# Patient Record
Sex: Male | Born: 1960 | Race: White | Hispanic: No | Smoking: Never smoker
Health system: Southern US, Community
[De-identification: ages and names within clinical notes are randomized; demographics above are authoritative.]

## PROBLEM LIST (undated history)

## (undated) DIAGNOSIS — Z789 Other specified health status: Secondary | ICD-10-CM

## (undated) DIAGNOSIS — I10 Essential (primary) hypertension: Secondary | ICD-10-CM

## (undated) HISTORY — DX: Essential (primary) hypertension: I10

## (undated) HISTORY — PX: APPENDECTOMY: SHX54

---

## 2011-08-16 ENCOUNTER — Encounter (HOSPITAL_BASED_OUTPATIENT_CLINIC_OR_DEPARTMENT_OTHER): Payer: Self-pay | Admitting: Orthopedic Surgery

## 2011-08-16 ENCOUNTER — Ambulatory Visit (HOSPITAL_BASED_OUTPATIENT_CLINIC_OR_DEPARTMENT_OTHER)
Admission: RE | Admit: 2011-08-16 | Discharge: 2011-08-16 | Disposition: A | Discharge status: Home / Self Care | Payer: Worker's Compensation | Source: Ambulatory Visit | Attending: Orthopedic Surgery | Admitting: Orthopedic Surgery

## 2011-08-16 ENCOUNTER — Encounter (HOSPITAL_BASED_OUTPATIENT_CLINIC_OR_DEPARTMENT_OTHER)
Admission: RE | Disposition: A | Discharge status: Home / Self Care | Payer: Self-pay | Source: Ambulatory Visit | Attending: Orthopedic Surgery

## 2011-08-16 DIAGNOSIS — S62639B Displaced fracture of distal phalanx of unspecified finger, initial encounter for open fracture: Secondary | ICD-10-CM | POA: Insufficient documentation

## 2011-08-16 DIAGNOSIS — W230XXA Caught, crushed, jammed, or pinched between moving objects, initial encounter: Secondary | ICD-10-CM | POA: Insufficient documentation

## 2011-08-16 DIAGNOSIS — Y929 Unspecified place or not applicable: Secondary | ICD-10-CM | POA: Insufficient documentation

## 2011-08-16 DIAGNOSIS — S52509A Unspecified fracture of the lower end of unspecified radius, initial encounter for closed fracture: Secondary | ICD-10-CM

## 2011-08-16 DIAGNOSIS — Y990 Civilian activity done for income or pay: Secondary | ICD-10-CM | POA: Insufficient documentation

## 2011-08-16 HISTORY — PX: I & D EXTREMITY: SHX5045

## 2011-08-16 SURGERY — MINOR IRRIGATION AND DEBRIDEMENT EXTREMITY
Anesthesia: LOCAL | Site: Finger | Laterality: Left | Wound class: Contaminated

## 2011-08-16 MED ORDER — CHLORHEXIDINE GLUCONATE 4 % EX LIQD: 60.0000 mL | Freq: Once | CUTANEOUS | Status: DC

## 2011-08-16 MED ORDER — HYDROCODONE-ACETAMINOPHEN 5-500 MG PO TABS: 1.0000 | ORAL_TABLET | ORAL | Status: AC | PRN

## 2011-08-16 MED ORDER — LIDOCAINE HCL (PF) 1 % IJ SOLN
INTRAMUSCULAR | Status: DC | PRN
  Administered 2011-08-16: 5 mL

## 2011-08-16 MED ORDER — BUPIVACAINE HCL (PF) 0.25 % IJ SOLN
INTRAMUSCULAR | Status: DC | PRN
  Administered 2011-08-16: 5 mL

## 2011-08-16 SURGICAL SUPPLY — 45 items
BAG DECANTER FOR FLEXI CONT (MISCELLANEOUS) IMPLANT
BANDAGE GAUZE ELAST BULKY 4 IN (GAUZE/BANDAGES/DRESSINGS) IMPLANT
BLADE MINI RND TIP GREEN BEAV (BLADE) IMPLANT
BLADE SURG 15 STRL LF DISP TIS (BLADE) ×1 IMPLANT
BLADE SURG 15 STRL SS (BLADE) ×2
BNDG CMPR 9X4 STRL LF SNTH (GAUZE/BANDAGES/DRESSINGS)
BNDG COHESIVE 1X5 TAN STRL LF (GAUZE/BANDAGES/DRESSINGS) ×1 IMPLANT
BNDG COHESIVE 3X5 TAN STRL LF (GAUZE/BANDAGES/DRESSINGS) IMPLANT
BNDG ESMARK 4X9 LF (GAUZE/BANDAGES/DRESSINGS) IMPLANT
BRUSH SCRUB DISP (MISCELLANEOUS) ×1 IMPLANT
CHLORAPREP W/TINT 26ML (MISCELLANEOUS) ×2 IMPLANT
CLOTH BEACON ORANGE TIMEOUT ST (SAFETY) ×2 IMPLANT
CORDS BIPOLAR (ELECTRODE) IMPLANT
COVER MAYO STAND STRL (DRAPES) ×2 IMPLANT
CUFF TOURNIQUET SINGLE 18IN (TOURNIQUET CUFF) IMPLANT
DRAIN PENROSE 1/2X12 LTX STRL (WOUND CARE) ×1 IMPLANT
DRAIN PENROSE 1/4X12 LTX STRL (WOUND CARE) IMPLANT
DRAPE SURG 17X23 STRL (DRAPES) ×2 IMPLANT
GAUZE PACKING IODOFORM 1/4X5 (PACKING) IMPLANT
GAUZE XEROFORM 1X8 LF (GAUZE/BANDAGES/DRESSINGS) ×2 IMPLANT
GLOVE BIO SURGEON STRL SZ 6.5 (GLOVE) ×3 IMPLANT
GLOVE SURG ORTHO 8.0 STRL STRW (GLOVE) ×2 IMPLANT
LOOP VESSEL MAXI BLUE (MISCELLANEOUS) IMPLANT
NEEDLE 27GAX1X1/2 (NEEDLE) ×1 IMPLANT
NS IRRIG 1000ML POUR BTL (IV SOLUTION) ×2 IMPLANT
PACK BASIN DAY SURGERY FS (CUSTOM PROCEDURE TRAY) ×2 IMPLANT
PAD CAST 3X4 CTTN HI CHSV (CAST SUPPLIES) IMPLANT
PADDING CAST ABS 4INX4YD NS (CAST SUPPLIES)
PADDING CAST ABS COTTON 4X4 ST (CAST SUPPLIES) ×1 IMPLANT
PADDING CAST COTTON 3X4 STRL (CAST SUPPLIES)
SPLINT PLASTER CAST XFAST 3X15 (CAST SUPPLIES) IMPLANT
SPLINT PLASTER XTRA FASTSET 3X (CAST SUPPLIES)
SPONGE GAUZE 4X4 12PLY (GAUZE/BANDAGES/DRESSINGS) ×2 IMPLANT
SUT CHROMIC 6 0 PS 4 (SUTURE) ×1 IMPLANT
SUT VICRYL RAPID 5 0 P 3 (SUTURE) IMPLANT
SUT VICRYL RAPIDE 4/0 PS 2 (SUTURE) ×1 IMPLANT
SWAB CULTURE LIQ STUART DBL (MISCELLANEOUS) IMPLANT
SYR BULB 3OZ (MISCELLANEOUS) ×2 IMPLANT
SYR CONTROL 10ML LL (SYRINGE) ×1 IMPLANT
TOWEL OR 17X24 6PK STRL BLUE (TOWEL DISPOSABLE) ×4 IMPLANT
TRAY DSU PREP LF (CUSTOM PROCEDURE TRAY) ×1 IMPLANT
TUBE ANAEROBIC SPECIMEN COL (MISCELLANEOUS) IMPLANT
TUBE FEEDING 5FR 15 INCH (TUBING) IMPLANT
UNDERPAD 30X30 INCONTINENT (UNDERPADS AND DIAPERS) ×2 IMPLANT
WATER STERILE IRR 1000ML POUR (IV SOLUTION) ×1 IMPLANT

## 2011-08-16 NOTE — Brief Op Note (Signed)
08/16/2011  1:17 PM  PATIENT:  Vincent Henderson  51 y.o. male  PRE-OPERATIVE DIAGNOSIS:  crush injury to left ring figner  POST-OPERATIVE DIAGNOSIS:  * No post-op diagnosis entered *  PROCEDURE:  Procedure(s) (LRB): MINOR IRRIGATION AND DEBRIDEMENT EXTREMITY (Left)  SURGEON:  Surgeon(s) and Role:    * Nicki Reaper, MD - Primary  PHYSICIAN ASSISTANT:   ASSISTANTS: none   ANESTHESIA:   local  EBL:     BLOOD ADMINISTERED:none  DRAINS: none   LOCAL MEDICATIONS USED:  MARCAINE    and XYLOCAINE   SPECIMEN:  No Specimen  DISPOSITION OF SPECIMEN:  N/A  COUNTS:  YES  TOURNIQUET:  * Missing tourniquet times found for documented tourniquets in log:  [id number] *  DICTATION: .Other Dictation: Dictation Number [id number]  PLAN OF CARE: Discharge to home after PACU  PATIENT DISPOSITION:  PACU - hemodynamically stable.

## 2011-08-16 NOTE — Discharge Instructions (Signed)

## 2011-08-16 NOTE — Op Note (Signed)
NAMEPAO, HAFFEY NO.:  [id number]  MEDICAL RECORD NO.:  [id number]  LOCATION:                                 FACILITY:  PHYSICIAN:  Cindee Salt, M.D.            DATE OF BIRTH:  DATE OF PROCEDURE:  08/16/2011 DATE OF DISCHARGE:                              OPERATIVE REPORT   PREOPERATIVE DIAGNOSIS:  Crush injury, open fracture nail-bed injury, left ring finger.  POSTOPERATIVE DIAGNOSIS:  Crush injury, open fracture nail-bed injury, left ring finger.  OPERATION:  Repair of nail bed, reduction and irrigation of distal phalanx fracture, left ring finger.  SURGEON:  Cindee Salt, M.D.  ANESTHESIA:  Metacarpal block.  HISTORY:  The patient is a 51 year old male who suffered a crush injury to his left ring finger when a manhole-cover fell on it.  He had this drained, but he has a full subungual hematoma with significant injury around the nail plate, nail matrix.  X-rays reveal a fracture of distal phalanx.  He is admitted for removal of nail plate with repair of nail bed and reduction of the fracture of distal phalanx.  He is aware of risks and complications.  In the preoperative area, the patient is seen, the extremity marked by both the patient and surgeon.  PROCEDURE:  The patient was brought to the operating room where a metacarpal block was given with 0.25% Marcaine and 1% Xylocaine both without epinephrine.  He was prepped using Betadine scrub and solution, followed by ChloraPrep.  A 3-minute dry time was allowed.  Time-out taken, confirming the patient and procedure.  After adequate anesthesia was afforded, following metacarpal block prior to the prep, a Penrose drain was used for tourniquet control at the base of the finger and the nail plate was removed.  The nail bed was then elevated allowing the complete full laceration across the entire width of the nail matrix. The fracture was irrigated and reduced.  The nail matrix was then repaired with  multiple interrupted 6-0 chromic sutures.  This stabilized the fracture.  The nail plate was then reapproximated with proximal nail fold.  A sterile compressive dressing and splint was applied.  The patient tolerated the procedure well.  With removal of the Penrose drain, finger pinked.  He was taken to the recovery room.  He will be discharged on Vicodin.          ______________________________ Cindee Salt, M.D.     GK/MEDQ  D:  08/16/2011  T:  08/16/2011  Job:  [id number]

## 2011-08-16 NOTE — H&P (Signed)
  [name] is a 51 year-old right-hand dominant male referred by the insurance company. He suffered a crush injury to his left ring finger while at work on 08/14/11 when a man hole cover tipped over on it.  He was seen by Dr. Wende Henderson where drill holes were placed in his nail plate. He complains of an intermittent mild throbbing pain with a feeling of swelling.   He states it has gotten somewhat better.  He has been wearing a splint. He has no prior history of injuries, no history of diabetes, thyroid problems, arthritis or gout.  X-rays reveal a comminuted fracture of the tuft of his finger.    ALLERGIES:   None.  MEDICATIONS:    Sulfamethoxazole and nabumetone.    SURGICAL HISTORY:    None.  FAMILY MEDICAL HISTORY:    Negative.  SOCIAL HISTORY:    He does not smoke or drink.  He is single.  REVIEW OF SYSTEMS:    Negative 14 points.   Vincent Henderson is an 51 y.o. male.   Chief Complaint: Crush lif HPI: see above  No past medical history on file.  No past surgical history on file.  No family history on file. Social History:  does not have a smoking history on file. He does not have any smokeless tobacco history on file. His alcohol and drug histories not on file.  Allergies: Not on File  No current facility-administered medications on file as of .   No current outpatient prescriptions on file as of .    No results found for this or any previous visit (from the past 48 hour(s)).  No results found.   Pertinent items are noted in HPI.  There were no vitals taken for this visit.  General appearance: alert, cooperative and appears stated age Head: Normocephalic, without obvious abnormality Neck: no adenopathy Resp: clear to auscultation bilaterally Cardio: regular rate and rhythm, S1, S2 normal, no murmur, click, rub or gallop GI: soft, non-tender; bowel sounds normal; no masses,  no organomegaly Extremities: chrush lrf Pulses: 2+ and symmetric Skin: Skin color, texture,  turgor normal. No rashes or lesions Neurologic: Grossly normal Incision/Wound: Crush lrf  Assessment/Plan DIAGNOSIS:     Crush injury, left ring finger.  He has a fracture of his distal phalanx with a full subungual hematoma.  This is indicative of a laceration beneath the nail.   RECOMMENDATIONS/PLAN:     We would recommend removal of the nail plate with repair, splinting.  This will be scheduled as an outpatient under local anesthesia.  Vincent Henderson R 08/16/2011, 11:48 AM

## 2011-08-16 NOTE — Op Note (Signed)
Dictated [id number]:

## 2011-08-19 ENCOUNTER — Encounter (HOSPITAL_BASED_OUTPATIENT_CLINIC_OR_DEPARTMENT_OTHER): Payer: Self-pay | Admitting: Orthopedic Surgery

## 2011-08-19 ENCOUNTER — Encounter (HOSPITAL_BASED_OUTPATIENT_CLINIC_OR_DEPARTMENT_OTHER): Payer: Self-pay

## 2012-04-07 ENCOUNTER — Encounter (HOSPITAL_COMMUNITY): Payer: Self-pay | Admitting: *Deleted

## 2012-04-07 ENCOUNTER — Emergency Department (HOSPITAL_COMMUNITY)
Admission: EM | Admit: 2012-04-07 | Discharge: 2012-04-07 | Disposition: A | Discharge status: Home / Self Care | Payer: PRIVATE HEALTH INSURANCE | Attending: Emergency Medicine | Admitting: Emergency Medicine

## 2012-04-07 ENCOUNTER — Emergency Department (HOSPITAL_COMMUNITY): Payer: PRIVATE HEALTH INSURANCE

## 2012-04-07 DIAGNOSIS — Y9389 Activity, other specified: Secondary | ICD-10-CM | POA: Insufficient documentation

## 2012-04-07 DIAGNOSIS — S52609A Unspecified fracture of lower end of unspecified ulna, initial encounter for closed fracture: Secondary | ICD-10-CM | POA: Diagnosis present

## 2012-04-07 DIAGNOSIS — S52509A Unspecified fracture of the lower end of unspecified radius, initial encounter for closed fracture: Secondary | ICD-10-CM

## 2012-04-07 DIAGNOSIS — W108XXA Fall (on) (from) other stairs and steps, initial encounter: Secondary | ICD-10-CM | POA: Insufficient documentation

## 2012-04-07 DIAGNOSIS — Y929 Unspecified place or not applicable: Secondary | ICD-10-CM | POA: Insufficient documentation

## 2012-04-07 DIAGNOSIS — S52539A Colles' fracture of unspecified radius, initial encounter for closed fracture: Secondary | ICD-10-CM

## 2012-04-07 MED ORDER — MORPHINE SULFATE 4 MG/ML IJ SOLN
4.0000 mg | Freq: Once | INTRAMUSCULAR | Status: AC
  Administered 2012-04-07: 4 mg via INTRAVENOUS
  Filled 2012-04-07: qty 1

## 2012-04-07 MED ORDER — LIDOCAINE HCL (PF) 1 % IJ SOLN
INTRAMUSCULAR | Status: AC
  Filled 2012-04-07: qty 10

## 2012-04-07 MED ORDER — OXYCODONE-ACETAMINOPHEN 5-325 MG PO TABS: 1.0000 | ORAL_TABLET | ORAL | Status: DC | PRN

## 2012-04-07 MED ORDER — OXYCODONE-ACETAMINOPHEN 5-325 MG PO TABS: 1.0000 | ORAL_TABLET | ORAL | Status: AC | PRN

## 2012-04-07 NOTE — ED Notes (Signed)
Fell down steps today, deformity of rt wrist.Good radial pulse, and distal sensation. Ice pack applied.

## 2012-04-07 NOTE — ED Notes (Signed)
Right wrist pain s/p fall; states slipped walking down steps and caught himself using right hand.  Obvious deformity noted to right wrist.

## 2012-04-07 NOTE — ED Provider Notes (Signed)
History     CSN: [id number]  Arrival date & time 04/07/12  1557   First MD Initiated Contact with Patient 04/07/12 1736      Chief Complaint  Patient presents with  . Wrist Injury    (Consider location/radiation/quality/duration/timing/severity/associated sxs/prior treatment) HPI Comments: Patient c/o pain, swelling and deformity to the right wrist.  Stats that he slipped and fell down some steps.  Fell on his right hand.  He denies numbness to his fingers, elbow pain, head injury, neck pain or LOC.  Also denies open wounds or other injuries.   Patient is a 51 y.o. male presenting with wrist pain. The history is provided by the patient.  Wrist Pain This is a new problem. Episode onset: 1-2 hrs PTA. The problem occurs constantly. The problem has been unchanged. Associated symptoms include arthralgias and joint swelling. Pertinent negatives include no chest pain, fever, headaches, myalgias, nausea, neck pain, numbness, rash, vomiting or weakness. Exacerbated by: movement and palpation. He has tried nothing for the symptoms. The treatment provided no relief.    History reviewed. No pertinent past medical history.  Past Surgical History  Procedure Date  . I&d extremity 08/16/2011    Procedure: MINOR IRRIGATION AND DEBRIDEMENT EXTREMITY;  Surgeon: Nicki Reaper, MD;  Location: [facility];  Service: Orthopedics;  Laterality: Left;  repair open fracture nailbed left ring fing    No family history on file.  History  Substance Use Topics  . Smoking status: Never Smoker   . Smokeless tobacco: Not on file  . Alcohol Use: No      Review of Systems  Constitutional: Negative for fever and appetite change.  HENT: Negative for neck pain.   Cardiovascular: Negative for chest pain.  Gastrointestinal: Negative for nausea and vomiting.  Musculoskeletal: Positive for joint swelling and arthralgias. Negative for myalgias, back pain and gait problem.  Skin: Negative for rash and  wound.  Neurological: Negative for dizziness, syncope, weakness, numbness and headaches.  All other systems reviewed and are negative.    Allergies  Review of patient's allergies indicates no known allergies.  Home Medications  No current outpatient prescriptions on file.  BP 126/78  Pulse 62  Temp 97.4 F (36.3 C) (Oral)  Resp 18  Ht 5\' 9"  (1.753 m)  Wt 180 lb (81.647 kg)  BMI 26.58 kg/m2  SpO2 100%  Physical Exam  Nursing note and vitals reviewed. Constitutional: He is oriented to person, place, and time. He appears well-developed and well-nourished. No distress.  HENT:  Head: Normocephalic and atraumatic.  Neck: Normal range of motion. Neck supple.  Cardiovascular: Normal rate, regular rhythm, normal heart sounds and intact distal pulses.   No murmur heard. Pulmonary/Chest: Effort normal and breath sounds normal. He exhibits no tenderness.  Musculoskeletal: He exhibits edema and tenderness.       Right wrist: He exhibits decreased range of motion, tenderness, bony tenderness, swelling and deformity. He exhibits no effusion, no crepitus and no laceration.       Arms:      Diffuse ttp right wrist   Obvious deformity, Radial pulse is brisk, distal sensation intact.  CR< 3 sec.  Pt has full ROM of the elbow.  Neurological: He is alert and oriented to person, place, and time. He exhibits normal muscle tone. Coordination normal.  Skin: Skin is warm and dry.    ED Course  Procedures (including critical care time)  Labs Reviewed - No data to display Dg Wrist Complete Right  04/07/2012  *RADIOLOGY REPORT*  Clinical Data: Right wrist pain post fall  RIGHT WRIST - COMPLETE 3+ VIEW  Comparison: None  Findings: Osseous mineralization grossly normal. Nonfused old ossicle at the tip of ulnar styloid. Comminuted distal right radial metaphyseal fracture with dorsal displacement and apex volar angulation. No definite extension into the radiocarpal joint. Joint spaces appear preserved.  No additional fracture or dislocation identified.  IMPRESSION: Comminuted displaced and angulated distal right radial metaphyseal fracture.   Original Report Authenticated By: Ulyses Southward, M.D.         MDM  Patient is resting comfortably, wrist is NV intact.    6:35 PM Consulted Dr. Romeo Apple.  Will see pt in dept for fracture reduction and further management.        Min Collymore L. [place], [place] 04/09/12 1822

## 2012-04-07 NOTE — Consult Note (Signed)
Reason for Consult: Right distal radius fracture Referring Physician: Dr. Eliane Decree is an 51 y.o. male.  HPI: 51 year old male fell on his steps earlier today he is right-hand-dominant he comes in with a painful deformed right wrist with slight numbness and tingling decreased range of motion and moderate to severe pain  Review of systems negative  History reviewed. No pertinent past medical history.  Past Surgical History  Procedure Date  . I&d extremity 08/16/2011    Procedure: MINOR IRRIGATION AND DEBRIDEMENT EXTREMITY;  Surgeon: Nicki Reaper, MD;  Location: [facility];  Service: Orthopedics;  Laterality: Left;  repair open fracture nailbed left ring fing    No family history on file.  Social History:  reports that he has never smoked. He does not have any smokeless tobacco history on file. He reports that he does not drink alcohol or use illicit drugs.  Allergies: No Known Allergies  Medications: I have reviewed the patient's current medications.  No results found for this or any previous visit (from the past 48 hour(s)).  Dg Wrist Complete Right  04/07/2012  *RADIOLOGY REPORT*  Clinical Data: Right wrist pain post fall  RIGHT WRIST - COMPLETE 3+ VIEW  Comparison: None  Findings: Osseous mineralization grossly normal. Nonfused old ossicle at the tip of ulnar styloid. Comminuted distal right radial metaphyseal fracture with dorsal displacement and apex volar angulation. No definite extension into the radiocarpal joint. Joint spaces appear preserved. No additional fracture or dislocation identified.  IMPRESSION: Comminuted displaced and angulated distal right radial metaphyseal fracture.   Original Report Authenticated By: Ulyses Southward, M.D.     ROS Blood pressure 126/78, pulse 62, temperature 97.4 F (36.3 C), temperature source Oral, resp. rate 18, height 5\' 9"  (1.753 m), weight 180 lb (81.647 kg), SpO2 100.00%. Physical Exam Physical Exam(12)  Vital  signs:   GENERAL: normal development   CDV: pulses are normal   Skin: normal  Lymph: nodes were not palpable/normal  Psychiatric: awake, alert and oriented  Neuro: normal sensation  MSK  Gait: Normal ambulation 1 Inspection   The right wrist is deformed with a classic dinner fork type deformity 2 Range of Motion poor range of motion and painful range of motion in the right wrist 3 Motor fingers are moving minimally because of pain 4 Stability joint stability is confirmed by x-ray not clinical exam  Other side:  5 normal range of motion strength stability alignment 6 normal neurovascular function  Imaging x-ray shows comminuted deformed displaced distal radius fracture the right upper extremity  Assessment: Comminuted closed distal radius fracture with angulation and displacement    Plan: Closed reduction   Assessment/Plan: Reduction was performed with 10 pounds of traction a sugar tong splint was applied  I've spoken with Dr. Butler Denmark of  [facility] orth[facility]ics and he will see the patient at 1:30 tomorrow afternoon Fuller Canada 04/07/2012, 8:39 PM   Procedure note Closed right distal radius fracture with angulation and displacement comminution Pre-and post diagnosis the same Procedure closed reduction right distal radius Verbal timeout was completed in verbal consent was obtained, need for emergent urgent reduction Anesthesia 10 cc of plain 1% lidocaine The wrist was prepped with alcohol and hematoma block was performed The patient was placed in 10 pounds of traction for approximately 10 minutes A manipulation was performed of the fragment distally Sugar tong splint applied Postreduction x-ray shows reestablishment of alignment Numbness and tingling have resolved

## 2012-04-08 ENCOUNTER — Encounter (HOSPITAL_COMMUNITY): Payer: Self-pay | Admitting: *Deleted

## 2012-04-09 ENCOUNTER — Encounter (HOSPITAL_COMMUNITY): Payer: Self-pay | Admitting: *Deleted

## 2012-04-09 ENCOUNTER — Encounter (HOSPITAL_COMMUNITY)
Admission: RE | Disposition: A | Discharge status: Home / Self Care | Payer: Self-pay | Source: Ambulatory Visit | Attending: Orthopedic Surgery

## 2012-04-09 ENCOUNTER — Encounter (HOSPITAL_COMMUNITY): Payer: Self-pay | Admitting: Anesthesiology

## 2012-04-09 ENCOUNTER — Encounter (HOSPITAL_COMMUNITY): Payer: Self-pay | Admitting: Certified Registered Nurse Anesthetist

## 2012-04-09 ENCOUNTER — Ambulatory Visit (HOSPITAL_COMMUNITY): Payer: PRIVATE HEALTH INSURANCE | Admitting: Anesthesiology

## 2012-04-09 ENCOUNTER — Observation Stay (HOSPITAL_COMMUNITY)
Admission: RE | Admit: 2012-04-09 | Discharge: 2012-04-10 | Disposition: A | Discharge status: Home / Self Care | Payer: PRIVATE HEALTH INSURANCE | Source: Ambulatory Visit | Attending: Orthopedic Surgery | Admitting: Orthopedic Surgery

## 2012-04-09 DIAGNOSIS — S52599A Other fractures of lower end of unspecified radius, initial encounter for closed fracture: Principal | ICD-10-CM | POA: Insufficient documentation

## 2012-04-09 DIAGNOSIS — X58XXXA Exposure to other specified factors, initial encounter: Secondary | ICD-10-CM | POA: Insufficient documentation

## 2012-04-09 DIAGNOSIS — S52509A Unspecified fracture of the lower end of unspecified radius, initial encounter for closed fracture: Secondary | ICD-10-CM

## 2012-04-09 HISTORY — PX: OPEN REDUCTION INTERNAL FIXATION (ORIF) DISTAL RADIAL FRACTURE: SHX5989

## 2012-04-09 HISTORY — DX: Other specified health status: Z78.9

## 2012-04-09 LAB — BASIC METABOLIC PANEL
Calcium: 8.9 mg/dL (ref 8.4–10.5)
GFR calc Af Amer: >90 mL/min (ref >90)
GFR calc non Af Amer: >90 mL/min (ref >90)
Glucose, Bld: 97 mg/dL (ref 70–99)
Sodium: 138 mEq/L (ref 135–145)

## 2012-04-09 LAB — CBC
Platelets: 171 10*3/uL (ref 150–400)
RDW: 13 % (ref 11.5–15.5)
WBC: 6.4 10*3/uL (ref 4.0–10.5)

## 2012-04-09 LAB — SURGICAL PCR SCREEN: Staphylococcus aureus: NEGATIVE

## 2012-04-09 SURGERY — OPEN REDUCTION INTERNAL FIXATION (ORIF) DISTAL RADIUS FRACTURE
Anesthesia: General | Site: Arm Lower | Laterality: Right | Wound class: Clean

## 2012-04-09 MED ORDER — CEFAZOLIN SODIUM 1-5 GM-% IV SOLN
1.0000 g | Freq: Three times a day (TID) | INTRAVENOUS | Status: DC
  Administered 2012-04-10 (×2): 1 g via INTRAVENOUS
  Filled 2012-04-09 (×4): qty 50

## 2012-04-09 MED ORDER — OXYCODONE HCL 5 MG PO TABS: 5.0000 mg | ORAL_TABLET | Freq: Once | ORAL | Status: DC | PRN

## 2012-04-09 MED ORDER — ONDANSETRON HCL 4 MG/2ML IJ SOLN: 4.0000 mg | Freq: Four times a day (QID) | INTRAMUSCULAR | Status: DC | PRN

## 2012-04-09 MED ORDER — CEFAZOLIN SODIUM 1-5 GM-% IV SOLN
1.0000 g | INTRAVENOUS | Status: AC
  Administered 2012-04-09: 1 g via INTRAVENOUS

## 2012-04-09 MED ORDER — LIDOCAINE HCL (CARDIAC) 20 MG/ML IV SOLN
INTRAVENOUS | Status: DC | PRN
  Administered 2012-04-09: 80 mg via INTRAVENOUS

## 2012-04-09 MED ORDER — MIDAZOLAM HCL 2 MG/2ML IJ SOLN
1.0000 mg | INTRAMUSCULAR | Status: AC | PRN
  Administered 2012-04-09 (×2): 1 mg via INTRAVENOUS

## 2012-04-09 MED ORDER — BUPIVACAINE-EPINEPHRINE PF 0.5-1:200000 % IJ SOLN
INTRAMUSCULAR | Status: DC | PRN
  Administered 2012-04-09: 30 mL

## 2012-04-09 MED ORDER — PROMETHAZINE HCL 25 MG RE SUPP: 12.5000 mg | Freq: Four times a day (QID) | RECTAL | Status: DC | PRN

## 2012-04-09 MED ORDER — CEFAZOLIN SODIUM-DEXTROSE 2-3 GM-% IV SOLR
INTRAVENOUS | Status: AC
  Filled 2012-04-09: qty 50

## 2012-04-09 MED ORDER — TAPENTADOL HCL 50 MG PO TABS: 50.0000 mg | ORAL_TABLET | ORAL | Status: DC | PRN

## 2012-04-09 MED ORDER — PHENYLEPHRINE HCL 10 MG/ML IJ SOLN
INTRAMUSCULAR | Status: DC | PRN
  Administered 2012-04-09 (×2): 40 ug via INTRAVENOUS

## 2012-04-09 MED ORDER — PROPOFOL 10 MG/ML IV BOLUS
INTRAVENOUS | Status: DC | PRN
  Administered 2012-04-09: 200 mg via INTRAVENOUS

## 2012-04-09 MED ORDER — DEXTROSE 5 % IV SOLN
INTRAVENOUS | Status: DC | PRN
  Administered 2012-04-09: 17:00:00 via INTRAVENOUS

## 2012-04-09 MED ORDER — MUPIROCIN 2 % EX OINT: TOPICAL_OINTMENT | Freq: Once | CUTANEOUS | Status: DC

## 2012-04-09 MED ORDER — MORPHINE SULFATE 2 MG/ML IJ SOLN: 1.0000 mg | INTRAMUSCULAR | Status: DC | PRN

## 2012-04-09 MED ORDER — MUPIROCIN 2 % EX OINT
TOPICAL_OINTMENT | CUTANEOUS | Status: AC
  Filled 2012-04-09: qty 22

## 2012-04-09 MED ORDER — VITAMIN C 500 MG PO TABS
1000.0000 mg | ORAL_TABLET | Freq: Every day | ORAL | Status: DC
  Administered 2012-04-10: 1000 mg via ORAL
  Filled 2012-04-09: qty 2

## 2012-04-09 MED ORDER — FAMOTIDINE 20 MG PO TABS
20.0000 mg | ORAL_TABLET | Freq: Two times a day (BID) | ORAL | Status: DC | PRN
  Filled 2012-04-09: qty 1

## 2012-04-09 MED ORDER — CEFAZOLIN SODIUM 1-5 GM-% IV SOLN
INTRAVENOUS | Status: AC
  Filled 2012-04-09: qty 50

## 2012-04-09 MED ORDER — METHOCARBAMOL 500 MG PO TABS: 500.0000 mg | ORAL_TABLET | Freq: Four times a day (QID) | ORAL | Status: DC | PRN

## 2012-04-09 MED ORDER — OXYCODONE HCL 5 MG/5ML PO SOLN: 5.0000 mg | Freq: Once | ORAL | Status: DC | PRN

## 2012-04-09 MED ORDER — DOCUSATE SODIUM 100 MG PO CAPS
100.0000 mg | ORAL_CAPSULE | Freq: Two times a day (BID) | ORAL | Status: DC
  Administered 2012-04-09 – 2012-04-10 (×2): 100 mg via ORAL
  Filled 2012-04-09 (×2): qty 1

## 2012-04-09 MED ORDER — FENTANYL CITRATE 0.05 MG/ML IJ SOLN
50.0000 ug | INTRAMUSCULAR | Status: AC | PRN
  Administered 2012-04-09 (×2): 50 ug via INTRAVENOUS

## 2012-04-09 MED ORDER — HYDROMORPHONE HCL PF 1 MG/ML IJ SOLN: 0.2500 mg | INTRAMUSCULAR | Status: DC | PRN

## 2012-04-09 MED ORDER — CEFAZOLIN SODIUM-DEXTROSE 2-3 GM-% IV SOLR
INTRAVENOUS | Status: DC | PRN
  Administered 2012-04-09: 2 g via INTRAVENOUS

## 2012-04-09 MED ORDER — MIDAZOLAM HCL 2 MG/2ML IJ SOLN
INTRAMUSCULAR | Status: AC
  Filled 2012-04-09: qty 2

## 2012-04-09 MED ORDER — ALPRAZOLAM 0.5 MG PO TABS: 0.5000 mg | ORAL_TABLET | Freq: Four times a day (QID) | ORAL | Status: DC | PRN

## 2012-04-09 MED ORDER — FENTANYL CITRATE 0.05 MG/ML IJ SOLN
INTRAMUSCULAR | Status: AC
  Filled 2012-04-09: qty 2

## 2012-04-09 MED ORDER — ONDANSETRON HCL 4 MG/2ML IJ SOLN
INTRAMUSCULAR | Status: DC | PRN
  Administered 2012-04-09: 4 mg via INTRAVENOUS

## 2012-04-09 MED ORDER — FENTANYL CITRATE 0.05 MG/ML IJ SOLN
INTRAMUSCULAR | Status: DC | PRN
  Administered 2012-04-09: 100 ug via INTRAVENOUS
  Administered 2012-04-09 (×4): 25 ug via INTRAVENOUS
  Administered 2012-04-09: 50 ug via INTRAVENOUS

## 2012-04-09 MED ORDER — ONDANSETRON HCL 4 MG PO TABS: 4.0000 mg | ORAL_TABLET | Freq: Four times a day (QID) | ORAL | Status: DC | PRN

## 2012-04-09 MED ORDER — LACTATED RINGERS IV SOLN
INTRAVENOUS | Status: DC | PRN
  Administered 2012-04-09: 16:00:00 via INTRAVENOUS

## 2012-04-09 MED ORDER — EPHEDRINE SULFATE 50 MG/ML IJ SOLN
INTRAMUSCULAR | Status: DC | PRN
  Administered 2012-04-09 (×2): 5 mg via INTRAVENOUS

## 2012-04-09 SURGICAL SUPPLY — 59 items
BANDAGE ELASTIC 3 VELCRO ST LF (GAUZE/BANDAGES/DRESSINGS) ×2 IMPLANT
BANDAGE ELASTIC 4 VELCRO ST LF (GAUZE/BANDAGES/DRESSINGS) ×2 IMPLANT
BANDAGE GAUZE ELAST BULKY 4 IN (GAUZE/BANDAGES/DRESSINGS) ×2 IMPLANT
BIT DRILL 2 FAST STEP (BIT) ×1 IMPLANT
BIT DRILL 2.5X4 QC (BIT) ×1 IMPLANT
BLADE SURG ROTATE 9660 (MISCELLANEOUS) IMPLANT
BNDG CMPR 9X4 STRL LF SNTH (GAUZE/BANDAGES/DRESSINGS) ×1
BNDG ESMARK 4X9 LF (GAUZE/BANDAGES/DRESSINGS) ×2 IMPLANT
CLOTH BEACON ORANGE TIMEOUT ST (SAFETY) ×2 IMPLANT
CORDS BIPOLAR (ELECTRODE) ×2 IMPLANT
COVER SURGICAL LIGHT HANDLE (MISCELLANEOUS) ×2 IMPLANT
CUFF TOURNIQUET SINGLE 18IN (TOURNIQUET CUFF) ×2 IMPLANT
CUFF TOURNIQUET SINGLE 24IN (TOURNIQUET CUFF) IMPLANT
DECANTER SPIKE VIAL GLASS SM (MISCELLANEOUS) IMPLANT
DRAIN TLS ROUND 10FR (DRAIN) IMPLANT
DRAPE OEC MINIVIEW 54X84 (DRAPES) ×1 IMPLANT
DRAPE U-SHAPE 47X51 STRL (DRAPES) ×2 IMPLANT
GAUZE XEROFORM 1X8 LF (GAUZE/BANDAGES/DRESSINGS) ×2 IMPLANT
GLOVE ORTHO TXT STRL SZ7.5 (GLOVE) ×2 IMPLANT
GLOVE SS BIOGEL STRL SZ 8 (GLOVE) ×1 IMPLANT
GLOVE SUPERSENSE BIOGEL SZ 8 (GLOVE) ×1
GOWN PREVENTION PLUS XLARGE (GOWN DISPOSABLE) ×3 IMPLANT
GOWN STRL NON-REIN LRG LVL3 (GOWN DISPOSABLE) ×8 IMPLANT
GOWN STRL REIN XL XLG (GOWN DISPOSABLE) ×4 IMPLANT
KIT BASIN OR (CUSTOM PROCEDURE TRAY) ×2 IMPLANT
KIT ROOM TURNOVER OR (KITS) ×2 IMPLANT
LOOP VESSEL MAXI BLUE (MISCELLANEOUS) IMPLANT
MANIFOLD NEPTUNE II (INSTRUMENTS) ×2 IMPLANT
NEEDLE 22X1 1/2 (OR ONLY) (NEEDLE) IMPLANT
NS IRRIG 1000ML POUR BTL (IV SOLUTION) ×2 IMPLANT
PACK ORTHO EXTREMITY (CUSTOM PROCEDURE TRAY) ×2 IMPLANT
PAD ARMBOARD 7.5X6 YLW CONV (MISCELLANEOUS) ×4 IMPLANT
PAD CAST 4YDX4 CTTN HI CHSV (CAST SUPPLIES) ×1 IMPLANT
PADDING CAST COTTON 4X4 STRL (CAST SUPPLIES) ×2
PADDING CAST SYNTHETIC 3 NS LF (CAST SUPPLIES) ×1
PADDING CAST SYNTHETIC 3X4 NS (CAST SUPPLIES) IMPLANT
PEG SUBCHONDRAL SMOOTH 2.0X20 (Peg) ×1 IMPLANT
PEG SUBCHONDRAL SMOOTH 2.0X22 (Peg) ×3 IMPLANT
PEG SUBCHONDRAL SMOOTH 2.0X24 (Peg) ×2 IMPLANT
PEG THREADED 2.5MMX24MM LONG (Peg) ×1 IMPLANT
PLATE STAN 24.4X59.5 RT (Plate) ×1 IMPLANT
PUTTY DBM STAGRAFT 2CC (Putty) ×1 IMPLANT
SCREW BN 12X3.5XNS CORT TI (Screw) IMPLANT
SCREW BN 13X3.5XNS CORT TI (Screw) IMPLANT
SCREW CORT 3.5X12 (Screw) ×6 IMPLANT
SCREW CORT 3.5X13 (Screw) ×2 IMPLANT
SPONGE GAUZE 4X4 12PLY (GAUZE/BANDAGES/DRESSINGS) ×2 IMPLANT
SPONGE LAP 4X18 X RAY DECT (DISPOSABLE) IMPLANT
SUT MNCRL AB 4-0 PS2 18 (SUTURE) ×2 IMPLANT
SUT PROLENE 3 0 PS 2 (SUTURE) IMPLANT
SUT VIC AB 3-0 FS2 27 (SUTURE) IMPLANT
SYR CONTROL 10ML LL (SYRINGE) IMPLANT
SYSTEM CHEST DRAIN TLS 7FR (DRAIN) ×1 IMPLANT
TOWEL OR 17X24 6PK STRL BLUE (TOWEL DISPOSABLE) ×2 IMPLANT
TOWEL OR 17X26 10 PK STRL BLUE (TOWEL DISPOSABLE) ×2 IMPLANT
TUBE CONNECTING 12X1/4 (SUCTIONS) ×2 IMPLANT
TUBE EVACUATION TLS (MISCELLANEOUS) ×2 IMPLANT
UNDERPAD 30X30 INCONTINENT (UNDERPADS AND DIAPERS) ×2 IMPLANT
WATER STERILE IRR 1000ML POUR (IV SOLUTION) ×2 IMPLANT

## 2012-04-09 NOTE — H&P (Signed)
Vincent Henderson is an 51 y.o. male.   Chief Complaint: Patients presents for ORIF of his right wrist fracture HPI: Marland KitchenMarland KitchenPatient presents for evaluation and treatment of the of their upper extremity predicament. The patient denies neck back chest or of abdominal pain. The patient notes that they have no lower extremity problems. The patient from primarily complains of the upper extremity pain noted.  Past Medical History  Diagnosis Date  . No pertinent past medical history     Past Surgical History  Procedure Date  . I&d extremity 08/16/2011    Procedure: MINOR IRRIGATION AND DEBRIDEMENT EXTREMITY;  Surgeon: Nicki Reaper, MD;  Location: [facility];  Service: Orthopedics;  Laterality: Left;  repair open fracture nailbed left ring fing  . Appendectomy     History reviewed. No pertinent family history. Social History:  reports that he has never smoked. He does not have any smokeless tobacco history on file. He reports that he drinks about 4.2 ounces of alcohol per week. He reports that he does not use illicit drugs.  Allergies: No Known Allergies  Medications Prior to Admission  Medication Sig Dispense Refill  . Ascorbic Acid (VITAMIN C) 1000 MG tablet Take 1,000 mg by mouth daily.      Marland Kitchen docusate sodium (COLACE) 50 MG capsule Take by mouth daily.      . methocarbamol (ROBAXIN) 500 MG tablet Take 500 mg by mouth every 6 (six) hours as needed.      . tapentadol (NUCYNTA) 50 MG TABS Take 50 mg by mouth every 4 (four) hours as needed. 1-2 tablets every 4-6 hours for breakthrough pain      . oxyCODONE-acetaminophen (PERCOCET/ROXICET) 5-325 MG per tablet Take 1 tablet by mouth every 4 (four) hours as needed for pain.  40 tablet  0  . oxyCODONE-acetaminophen (PERCOCET/ROXICET) 5-325 MG per tablet Take 1 tablet by mouth every 4 (four) hours as needed for pain.  6 tablet  0    Results for orders placed during the hospital encounter of 04/09/12 (from the past 48 hour(s))  BASIC METABOLIC  PANEL     Status: Normal   Collection Time   04/09/12  1:30 PM      Component Value Range Comment   Sodium 138  135 - 145 mEq/L    Potassium 3.8  3.5 - 5.1 mEq/L    Chloride 102  96 - 112 mEq/L    CO2 28  19 - 32 mEq/L    Glucose, Bld 97  70 - 99 mg/dL    BUN 11  6 - 23 mg/dL    Creatinine, Ser 1.61  0.50 - 1.35 mg/dL    Calcium 8.9  8.4 - 09.6 mg/dL    GFR calc non Af Amer >90  >90 mL/min    GFR calc Af Amer >90  >90 mL/min   CBC     Status: Abnormal   Collection Time   04/09/12  1:30 PM      Component Value Range Comment   WBC 6.4  4.0 - 10.5 K/uL    RBC 4.20 (*) 4.22 - 5.81 MIL/uL    Hemoglobin 11.6 (*) 13.0 - 17.0 g/dL    HCT 04.5 (*) 40.9 - 52.0 %    MCV 85.0  78.0 - 100.0 fL    MCH 27.6  26.0 - 34.0 pg    MCHC 32.5  30.0 - 36.0 g/dL    RDW 81.1  91.4 - 78.2 %    Platelets  171  150 - 400 K/uL    Dg Wrist 2 Views Right  04/07/2012  *RADIOLOGY REPORT*  Clinical Data: Post reduction images of right wrist fracture. Wrist pain traction.  RIGHT WRIST - 2 VIEW  Comparison: 04/07/2012.  Findings: Cast or splint material is identified to the right wrist. Comminuted distal radius fracture is again noted.  There is persistent loss of the normal volar tilt.  Volar and dorsal displacement of distal radial metaphyseal fractures.  Old well corticated ulnar styloid fracture fragment is noted.  IMPRESSION: Improved position and alignment of distal radius fracture. Persistent displacement of volar and dorsal distal radial metaphyseal fragments.   Original Report Authenticated By: Andreas Newport, M.D.    Dg Wrist Complete Right  04/07/2012  *RADIOLOGY REPORT*  Clinical Data: Right wrist pain post fall  RIGHT WRIST - COMPLETE 3+ VIEW  Comparison: None  Findings: Osseous mineralization grossly normal. Nonfused old ossicle at the tip of ulnar styloid. Comminuted distal right radial metaphyseal fracture with dorsal displacement and apex volar angulation. No definite extension into the radiocarpal  joint. Joint spaces appear preserved. No additional fracture or dislocation identified.  IMPRESSION: Comminuted displaced and angulated distal right radial metaphyseal fracture.   Original Report Authenticated By: Ulyses Southward, M.D.     Review of Systems  Constitutional: Negative.   HENT: Negative.   Eyes: Negative.   Respiratory: Negative.   Cardiovascular: Negative.   Gastrointestinal: Negative.   Skin: Negative.   Neurological: Negative.   Psychiatric/Behavioral: Negative.     Blood pressure 114/72, pulse 61, temperature 98.2 F (36.8 C), temperature source Oral, resp. rate 18, weight 81.3 kg (179 lb 3.7 oz), SpO2 99.00%. Physical Exam ..The patient is alert and oriented in no acute distress the patient complains of pain in the affected upper extremity. The patient is noted to have a normal HEENT exam. Lung fields show equal chest expansion and no shortness of breath abdomen exam is nontender without distention. Lower extremity examination does not show any fracture dislocation or blood clot symptoms. Pelvis is stable neck and back are stable and nontender  Assessment/Plan .Marland KitchenWe are planning surgery for your upper extremity. The risk and benefits of surgery include risk of bleeding infection anesthesia damage to normal structures and failure of the surgery to accomplish its intended goals of relieving symptoms and restoring function with this in mind we'll going to proceed. I have specifically discussed with the patient the pre-and postoperative regime and the does and don'ts and risk and benefits in great detail. Risk and benefits of surgery also include risk of dystrophy chronic nerve pain failure of the healing process to go onto completion and other inherent risks of surgery The relavent the pathophysiology of the disease/injury process, as well as the alternatives for treatment and postoperative course of action has been discussed in great detail with the patient who desires to  proceed.  We will do everything in our power to help you (the patient) restore function to the upper extremity. Is a pleasure to see this patient today.   Karen Chafe 04/09/2012, 3:41 PM

## 2012-04-09 NOTE — Anesthesia Postprocedure Evaluation (Signed)
Anesthesia Post Note  Patient: Vincent Henderson  Procedure(s) Performed: Procedure(s) (LRB): OPEN REDUCTION INTERNAL FIXATION (ORIF) DISTAL RADIAL FRACTURE (Right)  Anesthesia type: General  Patient location: PACU  Post pain: Pain level controlled and Adequate analgesia  Post assessment: Post-op Vital signs reviewed, Patient's Cardiovascular Status Stable, Respiratory Function Stable, Patent Airway and Pain level controlled  Last Vitals:  Filed Vitals:   04/09/12 1812  BP:   Pulse: 103  Temp:   Resp: 17    Post vital signs: Reviewed and stable  Level of consciousness: awake, alert  and oriented  Complications: No apparent anesthesia complications

## 2012-04-09 NOTE — Anesthesia Preprocedure Evaluation (Addendum)
Anesthesia Evaluation  Patient identified by MRN, date of birth, ID band Patient awake    Reviewed: Allergy & Precautions, H&P , NPO status , Patient's Chart, lab work & pertinent test results, reviewed documented beta blocker date and time   Airway Mallampati: II TM Distance: >3 FB Neck ROM: full    Dental  (+) Teeth Intact and Dental Advisory Given   Pulmonary          Cardiovascular     Neuro/Psych    GI/Hepatic   Endo/Other    Renal/GU      Musculoskeletal   Abdominal   Peds  Hematology   Anesthesia Other Findings   Reproductive/Obstetrics                          Anesthesia Physical Anesthesia Plan  ASA: I  Anesthesia Plan: General and Regional   Post-op Pain Management:    Induction: Intravenous  Airway Management Planned: LMA  Additional Equipment:   Intra-op Plan:   Post-operative Plan:   Informed Consent: I have reviewed the patients History and Physical, chart, labs and discussed the procedure including the risks, benefits and alternatives for the proposed anesthesia with the patient or authorized representative who has indicated his/her understanding and acceptance.     Plan Discussed with: CRNA and Surgeon  Anesthesia Plan Comments:         Anesthesia Quick Evaluation

## 2012-04-09 NOTE — Op Note (Signed)
See dictation# [id number] Dominica Severin MD

## 2012-04-09 NOTE — Preoperative (Signed)
Beta Blockers   Reason not to administer Beta Blockers:Not Applicable 

## 2012-04-09 NOTE — Transfer of Care (Signed)
Immediate Anesthesia Transfer of Care Note  Patient: Vincent Henderson  Procedure(s) Performed: Procedure(s) (LRB) with comments: OPEN REDUCTION INTERNAL FIXATION (ORIF) DISTAL RADIAL FRACTURE (Right) - ORIF RIGHT WRIST FRACTURE WITH ALLOGRAFT BONE GRAFT AND REPAIR/RECONSTRUCTION AS NECCESSARY CASE QUALIFIED FOR MOPUP, PATIENT SEEN IN ED ON CALL*  Patient Location: PACU  Anesthesia Type:General  Level of Consciousness: awake and patient cooperative  Airway & Oxygen Therapy: Patient Spontanous Breathing and Patient connected to face mask oxygen  Post-op Assessment: Report given to PACU RN, Post -op Vital signs reviewed and stable and Patient moving all extremities X 4  Post vital signs: Reviewed and stable  Complications: No apparent anesthesia complications

## 2012-04-09 NOTE — Progress Notes (Signed)
No order for consent.

## 2012-04-09 NOTE — Anesthesia Procedure Notes (Addendum)
Anesthesia Regional Block:  Supraclavicular block  Pre-Anesthetic Checklist: ,, timeout performed, Correct Patient, Correct Site, Correct Laterality, Correct Procedure, Correct Position, site marked, Risks and benefits discussed,  Surgical consent,  Pre-op evaluation,  At surgeon's request and post-op pain management  Laterality: Right  Prep: chloraprep       Needles:  Injection technique: Single-shot  Needle Type: Echogenic Stimulator Needle     Needle Length: 5cm 5 cm Needle Gauge: 22 and 22 G    Additional Needles:  Procedures: ultrasound guided (picture in chart) and nerve stimulator Supraclavicular block  Nerve Stimulator or Paresthesia:  Response: biceps flexion, 0.45 mA,   Additional Responses:   Narrative:  Start time: 04/09/2012 3:35 PM End time: 04/09/2012 3:48 PM Injection made incrementally with aspirations every 5 mL.  Performed by: Personally  Anesthesiologist: Dr Chaney Malling  Additional Notes: Functioning IV was confirmed and monitors were applied.  A 50mm 22ga Arrow echogenic stimulator needle was used. Sterile prep and drape,hand hygiene and sterile gloves were used.  Negative aspiration and negative test dose prior to incremental administration of local anesthetic. The patient tolerated the procedure well.  Ultrasound guidance: relevent anatomy identified, needle position confirmed, local anesthetic spread visualized around nerve(s), vascular puncture avoided.  Image printed for medical record.   Supraclavicular block Procedure Name: LMA Insertion Date/Time: 04/09/2012 4:33 PM Performed by: Darcey Nora B Pre-anesthesia Checklist: Patient identified, Emergency Drugs available, Suction available and Patient being monitored Patient Re-evaluated:Patient Re-evaluated prior to inductionOxygen Delivery Method: Circle system utilized Preoxygenation: Pre-oxygenation with 100% oxygen Intubation Type: IV induction Ventilation: Mask ventilation without  difficulty LMA Size: 5.0 Number of attempts: 1 Tube secured with: Tape (taped across cheeks) Dental Injury: Teeth and Oropharynx as per pre-operative assessment

## 2012-04-10 ENCOUNTER — Encounter (HOSPITAL_COMMUNITY): Payer: Self-pay | Admitting: General Practice

## 2012-04-10 MED FILL — Mupirocin Oint 2%: CUTANEOUS | Qty: 22 | Status: AC

## 2012-04-10 NOTE — Discharge Summary (Signed)
Physician Discharge Summary  Patient ID: Vincent Henderson MRN: [id number] DOB/AGE: [birth year] 51 y.o.  Admit date: 04/09/2012 Discharge date: 04/10/2012  Admission Diagnoses: Right distal radius fracture and ulna fracture  Discharge Diagnoses: Same, status post open reduction internal fixation right distal radius fracture with closed treatment   Discharged Condition: Improved  Hospital Course: The patient is a pleasant 51 year old male who presented to [facility] for evaluation of his right upper extremity. He was initially seen and evaluated by Dr. Fuller [place] in the [place] area after sustaining a comminuted end articular distal radius fracture. He underwent preliminary reduction measures by Dr. Tiburcio Pea and however given the high propensity for progressive collapse and angulation hand surgery was consult at. We seen and evaluated patient in our office setting and recommend surgical intervention for definitive fixation. The patient was admitted to the hospital on 04/09/2012 he underwent the above-mentioned procedure without complications please see operative report for full details. The patient was subsequently made to the orthopedic unit for IV antibiotics, pain management, close observation, elevation and edema control of the upper extremity. The patient did extremely well and on postoperative day #1 he was having no significant pain or difficulties. His pain was controlled with the pain medications. He was voiding without difficulties tolerating a regular diet. He was eager for discharge home. The patient's vital signs were noted the stable he was afebrile and overall looked very good postoperative day 1  Consults: None  Treatments: See operative report  Discharge Exam: Blood pressure 145/96, pulse 91, temperature 98.6 F (37 C), temperature source Oral, resp. rate 18, weight 81.3 kg (179 lb 3.7 oz), SpO2 94.00%. Marland Kitchen.Patient presents for evaluation and treatment of  the of their upper extremity predicament. The patient denies neck back chest or of abdominal pain. The patient notes that they have no lower extremity problems. The patient from primarily complains of the upper extremity pain noted. Evaluation of the right upper extremity shows his splint is intact, he has mild edema of the digits. We have discussed with him active range of motion and passive range of motion as well as massage of the digits. Neurovascularly he is intact. He has no signs of dystrophy, drain is removed. No signs of infection Disposition: 01-Home or Self Care  Discharge Orders    Future Orders Please Complete By Expires   Diet - low sodium heart healthy      Call MD / Call 911      Comments:   If you experience chest pain or shortness of breath, CALL 911 and be transported to the hospital emergency room.  If you develope a fever above 101 F, pus (white drainage) or increased drainage or redness at the wound, or calf pain, call your surgeon's office.   Constipation Prevention      Comments:   Drink plenty of fluids.  Prune juice may be helpful.  You may use a stool softener, such as Colace (over the counter) 100 mg twice a day.  Use MiraLax (over the counter) for constipation as needed.   Increase activity slowly as tolerated      Discharge instructions      Comments:   Marland KitchenMarland KitchenKeep bandage clean and dry.  Call for any problems.  No smoking.  Criteria for driving a car: you should be off your pain medicine for 7-8 hours, able to drive one handed(confident), thinking clearly and feeling able in your judgement to drive. Continue elevation as it will decrease swelling.  If instructed by  MD move your fingers within the confines of the bandage/splint.  Use ice if instructed by your MD. Call immediately for any sudden loss of feeling in your hand/arm or change in functional abilities of the extremity.       Medication List     As of 04/10/2012 12:53 PM    TAKE these medications          docusate sodium 50 MG capsule   Commonly known as: COLACE   Take by mouth daily.      methocarbamol 500 MG tablet   Commonly known as: ROBAXIN   Take 500 mg by mouth every 6 (six) hours as needed.      oxyCODONE-acetaminophen 5-325 MG per tablet   Commonly known as: PERCOCET/ROXICET   Take 1 tablet by mouth every 4 (four) hours as needed for pain.      oxyCODONE-acetaminophen 5-325 MG per tablet   Commonly known as: PERCOCET/ROXICET   Take 1 tablet by mouth every 4 (four) hours as needed for pain.      tapentadol 50 MG Tabs   Commonly known as: NUCYNTA   Take 50 mg by mouth every 4 (four) hours as needed. 1-2 tablets every 4-6 hours for breakthrough pain      vitamin C 1000 MG tablet   Take 1,000 mg by mouth daily.           Follow-up Information    Follow up with Karen Chafe, MD. Schedule an appointment as soon as possible for a visit in 10 days.   Contact information:   [address] [address] [address] [place] [address] [phone]          Signed: Sheran Lawless 04/10/2012, 12:53 PM

## 2012-04-10 NOTE — Progress Notes (Signed)
Utilization review completed. Lillyann Ahart, RN, BSN. 

## 2012-04-10 NOTE — Evaluation (Signed)
Occupational Therapy Evaluation Patient Details Name: Vincent Henderson MRN: [id number] DOB: 06-Jul-1960 Today's Date: 04/10/2012 Time: 4696-2952 OT Time Calculation (min): 43 min  OT Assessment / Plan / Recommendation Clinical Impression  Pleasant 51 yr old male admitted for right ORIF of the distal radius.  Demonstrates limited RUE functional use and AROM.  Has been educated on edema control, AROM exercises, and adaptive techniques for selfcare.  No further acute or post acute OT needs until the cast is removed.    OT Assessment  Patient does not need any further OT services    Follow Up Recommendations  No OT follow up       Equipment Recommendations  None recommended by OT          Precautions / Restrictions Precautions Required Braces or Orthoses: Other Brace/Splint Other Brace/Splint: has sling for comfort Restrictions Weight Bearing Restrictions: Yes RUE Weight Bearing: Non weight bearing   Pertinent Vitals/Pain Pain 7/10 in the right wrist    ADL  Eating/Feeding: Simulated;Modified independent Where Assessed - Eating/Feeding: Chair Grooming: Simulated;Modified independent Where Assessed - Grooming: Unsupported sitting Upper Body Bathing: Simulated;Minimal assistance Where Assessed - Upper Body Bathing: Unsupported sitting Lower Body Bathing: Simulated;Supervision/safety Where Assessed - Lower Body Bathing: Unsupported sit to stand Upper Body Dressing: Simulated;Set up Where Assessed - Upper Body Dressing: Unsupported sitting Lower Body Dressing: Simulated;Minimal assistance Where Assessed - Lower Body Dressing: Unsupported sit to stand Toilet Transfer: Simulated;Modified independent Toilet Transfer Method: Other (comment) (ambulate without assistive device) Toilet Transfer Equipment: Regular height toilet Toileting - Clothing Manipulation and Hygiene: Simulated;Modified independent Where Assessed - Toileting Clothing Manipulation and Hygiene: Sit to stand from  3-in-1 or toilet Tub/Shower Transfer: Simulated;Modified independent Tub/Shower Transfer Method: Ambulating Transfers/Ambulation Related to ADLs: Pt is independent for mobility without use of assistive device. ADL Comments: Pt educated on edema control, elevation, digit and shoulder AROM as well.  Discussed clothing recommendations and adaptive techniques also. No further OT needs, will have supervision from his family.      Visit Information  Last OT Received On: 04/10/12 Assistance Needed: +1    Subjective Data  Subjective: I just stepped part of the way up on the step and slipped. Patient Stated Goal: To get out of bed and go home as soon as possible.   Prior Functioning     Home Living Lives With: Son Available Help at Discharge: Available 24 hours/day Type of Home: House Home Access: Stairs to enter [employer] of Steps: 3 Entrance Stairs-Rails: Left Home Layout: One level Bathroom Shower/Tub: Engineer, manufacturing systems: Standard Bathroom Accessibility: Yes Home Adaptive Equipment: None Prior Function Level of Independence: Independent Able to Take Stairs?: Yes Driving: Yes Vocation: Full time employment Communication Communication: No difficulties Dominant Hand: Right         Vision/Perception Vision - Assessment Eye Alignment: Within Functional Limits Vision Assessment: Vision not tested Perception Perception: Within Functional Limits Praxis Praxis: Intact   Cognition  Overall Cognitive Status: Appears within functional limits for tasks assessed/performed Arousal/Alertness: Awake/alert Orientation Level: Appears intact for tasks assessed Behavior During Session: [facility] for tasks performed    Extremity/Trunk Assessment Right Upper Extremity Assessment RUE ROM/Strength/Tone: Deficits RUE ROM/Strength/Tone Deficits: Pt with cast from distal MPs to proximal elbow.  Demonstrates approximately 30% digit flexion and extension.  Full AROM  shoulder flexion as well. RUE Sensation: Deficits RUE Sensation Deficits: Pt with slight numbness in digits compared to the left hand. RUE Coordination: Deficits RUE Coordination Deficits: Limitations secondary to AROM and confines  of the cast. Left Upper Extremity Assessment LUE ROM/Strength/Tone: Within functional levels LUE Sensation: WFL - Light Touch LUE Coordination: WFL - gross/fine motor Trunk Assessment Trunk Assessment: Normal     Mobility Bed Mobility Bed Mobility: Supine to Sit Supine to Sit: 7: Independent Transfers Transfers: Sit to Stand Sit to Stand: 7: Independent           Balance Balance Balance Assessed: Yes Dynamic Standing Balance Dynamic Standing - Balance Support: No upper extremity supported Dynamic Standing - Level of Assistance: 7: Independent   End of Session OT - End of Session Activity Tolerance: Patient tolerated treatment well Patient left: in chair;with call bell/phone within reach;with family/visitor present Nurse Communication: Mobility status  GO Functional Assessment Tool Used: clinical judgement Functional Limitation: Self care Self Care Current Status (W0981): At least 1 percent but less than 20 percent impaired, limited or restricted Self Care Goal Status (X9147): At least 1 percent but less than 20 percent impaired, limited or restricted Self Care Discharge Status [phone]): At least 1 percent but less than 20 percent impaired, limited or restricted   Latashia Koch OTR/L Pager number [phone] 04/10/2012, 10:05 AM

## 2012-04-10 NOTE — ED Provider Notes (Signed)
Medical screening examination/treatment/procedure(s) were performed by non-physician practitioner and as supervising physician I was immediately available for consultation/collaboration.  Glynn Octave, MD 04/10/12 [phone]

## 2012-04-10 NOTE — Op Note (Signed)
[name], BORD NO.:  [id number]  MEDICAL RECORD NO.:  [id number]  LOCATION:  [id number]                        FACILITY:  [facility]  PHYSICIAN:  Vincent Henderson, M.D.DATE OF BIRTH:  [birth year]  DATE OF PROCEDURE: DATE OF DISCHARGE:                              OPERATIVE REPORT   PREOPERATIVE DIAGNOSIS:  Comminuted complex intra-articular displaced distal radius fracture, right upper extremity.  POSTOPERATIVE DIAGNOSIS:  Comminuted complex intra-articular displaced distal radius fracture, right upper extremity.  PROCEDURE: 1. Open reduction, internal fixation greater than 5 part distal radius     fracture, right upper extremity. 2. Stress radiography. 3. Brachioradialis sliding tenotomy.  SURGEON:  Vincent Henderson, M.D.  ASSISTANT:  Vincent Henderson, P.A.-C.  COMPLICATIONS:  None.  ANESTHESIA:  General.  TOURNIQUET TIME:  Less than an hour.  DRAINS:  One.  INDICATIONS:  Vincent Henderson presents with above-mentioned diagnosis.  I have counseled him regarding risks and benefits of surgery, and he desires to proceed the above-mentioned operative intervention.  All questions have been encouraged and answered.  He was referred by Dr. Fuller Henderson for definitive treatment after a comminuted fracture process.  OPERATIVE PROCEDURE IN DETAIL:  The patient was seen by myself and anesthesia and taken to operative suite, underwent a block anesthetic in the holding area.  Following this, he was taken to the operative arena, laid supine.  Following this, I prepped and draped in usual a sterile fashion with Betadine scrub and paint about the right upper extremity. Following this, LMA general anesthesia was once again checked and noted to be excellently secured and at this juncture, the operation commenced with a time-out followed by incision volar radially.  Dissection was carried down under 250 mm tourniquet control.  The skin edge of course was incised.   The FCR tendon sheath was incised palmarly and dorsally. Following this, the carpal canal contents were retracted ulnarly.  The pronator was incised and reflected ulnarly.  The patient had jagged comminuted displaced volar cortex and I very carefully performed a sliding brachioradialis tenotomy to lessen the deforming force of the distal portion of the fracture fragment.  Following this, ample amounts of allograft bone graft was placed in the defect followed by placement of a standard DVR plate and screw construct under standard AO technique. I was able to achieve radial height inclination and volar tilt. Following application of the plate, I checked the distal radioulnar joint mechanics and scapholunate and lunotriquetral joints and all looked quite well.  He had a smooth arc of motion.  Despite a high degree of comminution, he pieced together very well and had good structural stability.  Once this was completed, I spent a great deal of time closing the pronator to make sure it was nicely closed.  The patient had a drain placed followed by closure of the skin edge with Prolene after tourniquet was deflated and copious irrigation was applied to the wound.  The patient will be monitored in the recovery room and admitted overnight for IV antibiotics, general pain management, and postop observation.  He did have an ulnar fracture fragment which pieced together nicely and was away from the distal radioulnar joint and the  DRUJ mechanics and TFC stability appeared to be very satisfactory to my exam on the table.  We will watch his condition closely.  We look forward to participate in his postop care plan according to our standard DVR protocol.     Vincent Henderson, M.D.     Vincent Henderson  D:  04/09/2012  T:  04/10/2012  Job:  [id number]

## 2012-04-14 MED FILL — Oxycodone w/ Acetaminophen Tab 5-325 MG: ORAL | Qty: 6 | Status: AC

## 2014-05-06 IMAGING — CR DG WRIST COMPLETE 3+V*R*
2 series · 2 of 2 positions shown · non-contrast
Comparison: None

CLINICAL DATA: Right wrist pain post fall

RIGHT WRIST - COMPLETE 3+ VIEW

[view not recorded (1 of 2)]
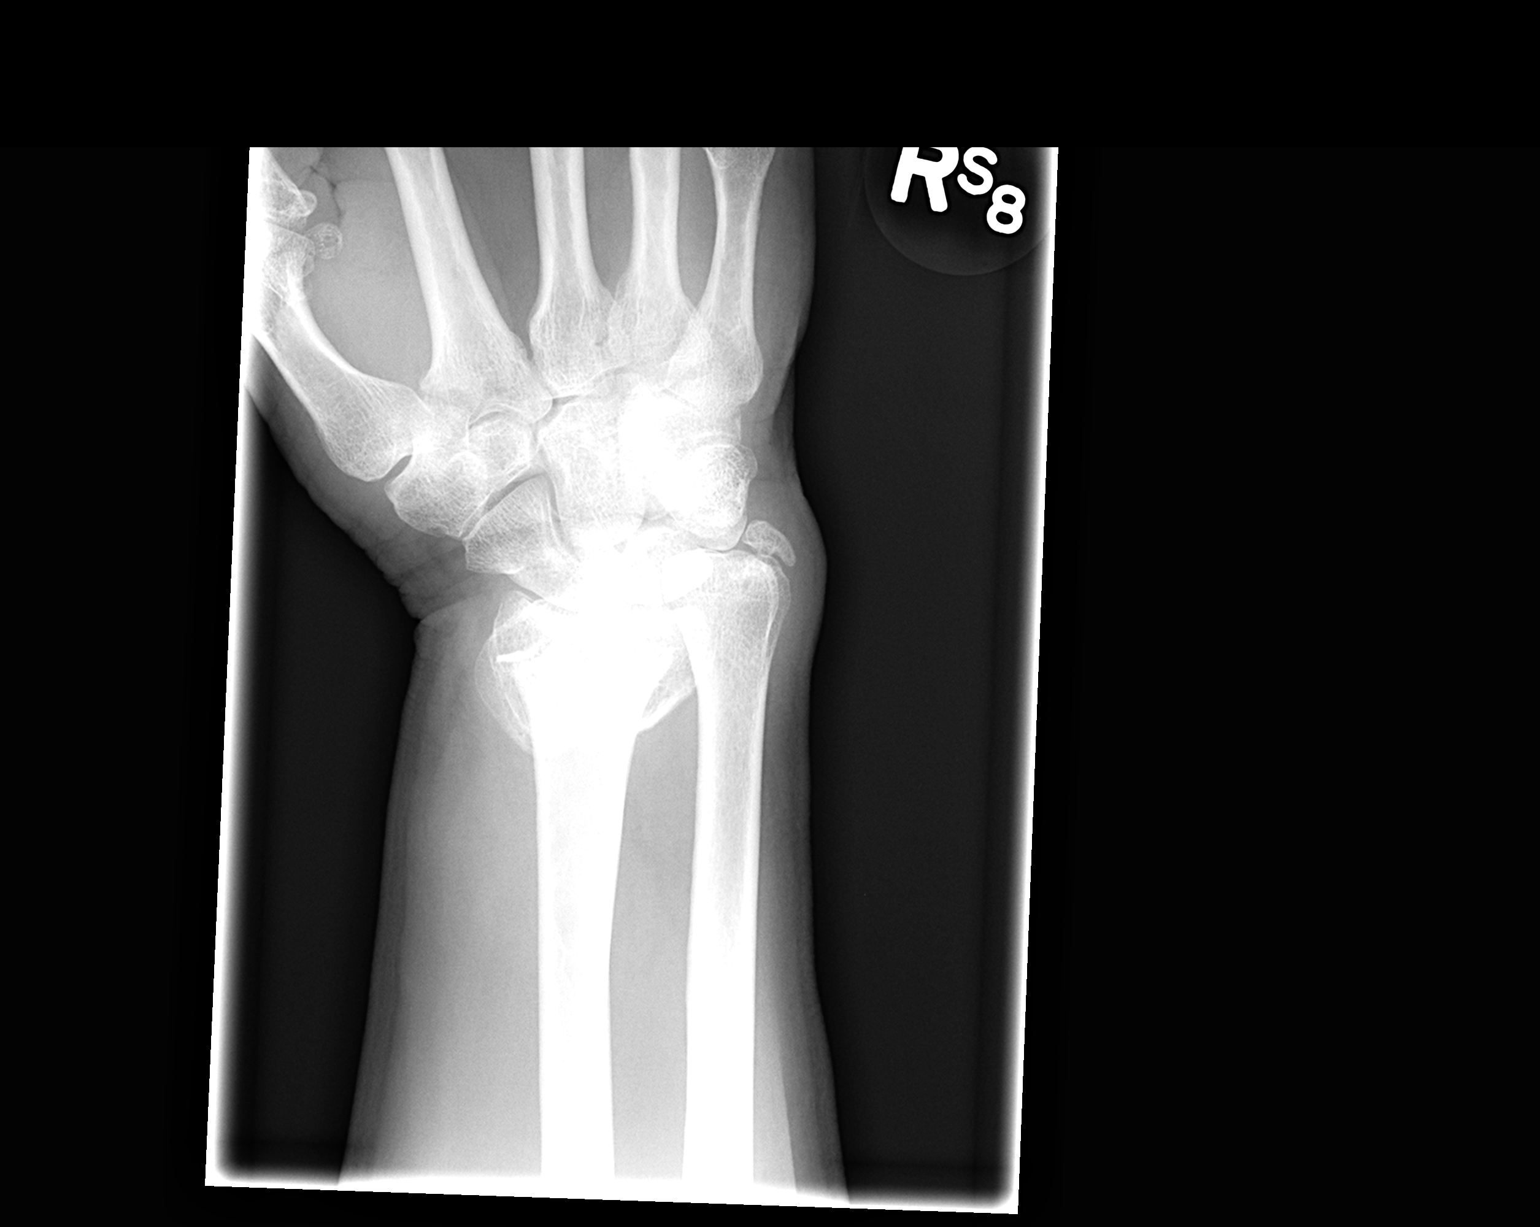

[view not recorded (2 of 2)]
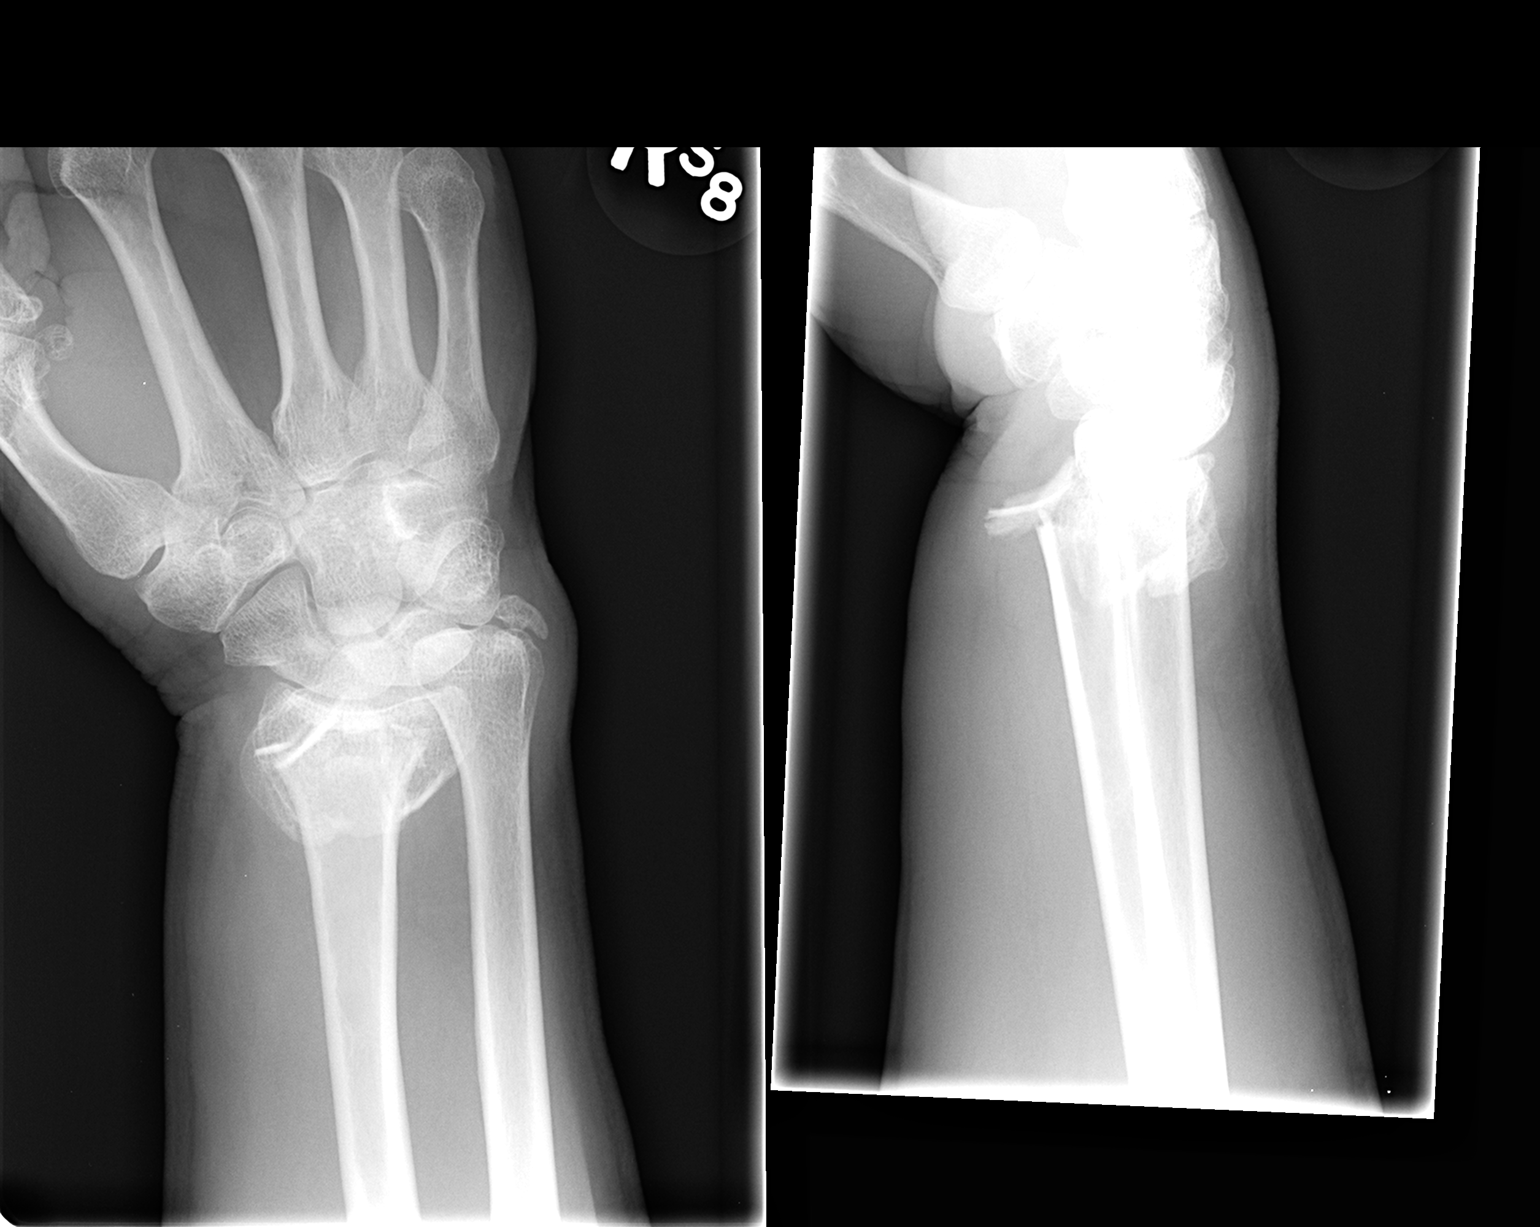

[2 of 2 positions shown; findings below may reference images not displayed]

FINDINGS: Osseous mineralization grossly normal.
Nonfused old ossicle at the tip of ulnar styloid.
Comminuted distal right radial metaphyseal fracture with dorsal
displacement and apex volar angulation.
No definite extension into the radiocarpal joint.
Joint spaces appear preserved.
No additional fracture or dislocation identified.
IMPRESSION: Comminuted displaced and angulated distal right radial metaphyseal
fracture.

## 2016-01-02 ENCOUNTER — Telehealth: Payer: Self-pay

## 2016-01-02 NOTE — Telephone Encounter (Addendum)
Gastroenterology Pre-Procedure Review  Request Date: Requesting Physician:   PATIENT REVIEW QUESTIONS: The patient responded to the following health history questions as indicated:    1. Diabetes Melitis:NO  2. Joint replacements in the past 12 months: NO 3. Major health problems in the past 3 months: NO 4. Has an artificial valve or MVP: NO 5. Has a defibrillator: NO 6. Has been advised in past to take antibiotics in advance of a procedure like teeth cleaning: NO 7. Family history of colon cancer: NO 8. Alcohol Use: SOCIALLY 9. History of sleep apnea: NO    MEDICATIONS & ALLERGIES:    Patient reports the following regarding taking any blood thinners:   Plavix?NO Aspirin? NO Coumadin? NO  Patient confirms/reports the following medications:  Current Outpatient Prescriptions  Medication Sig Dispense Refill  . tamsulosin (FLOMAX) 0.4 MG CAPS capsule Take 0.4 mg by mouth daily.     No current facility-administered medications for this visit.     Patient confirms/reports the following allergies:  No Known Allergies  No orders of the defined types were placed in this encounter.   AUTHORIZATION INFORMATION Primary InsuranceLolly Mustache  ID #: [id number]  Group #: [id number] Pre-Cert / Josem Kaufmann required:  Pre-Cert / Auth #:    SCHEDULE INFORMATION: Procedure has been scheduled as follows:  Date:02/05/16  , Time:  2:00 PM Location:   This Gastroenterology Pre-Precedure Review Form is being routed to the following provider(s): R. Garfield Cornea, MD

## 2016-01-03 NOTE — Telephone Encounter (Addendum)
Appropriate for colonoscopy.  

## 2016-01-03 NOTE — Telephone Encounter (Signed)
Forwarding to Ginger who triaged.  

## 2016-01-08 ENCOUNTER — Other Ambulatory Visit: Payer: Self-pay

## 2016-01-08 DIAGNOSIS — Z1211 Encounter for screening for malignant neoplasm of colon: Secondary | ICD-10-CM

## 2016-01-08 MED ORDER — PEG-KCL-NACL-NASULF-NA ASC-C 100 G PO SOLR: 1.0000 | ORAL | 0 refills | Status: DC

## 2016-01-08 NOTE — Telephone Encounter (Signed)
Orders are in and instructions are in the mail 

## 2016-02-05 ENCOUNTER — Ambulatory Visit (HOSPITAL_COMMUNITY)
Admission: RE | Admit: 2016-02-05 | Discharge: 2016-02-05 | Disposition: A | Discharge status: Home / Self Care | Payer: PRIVATE HEALTH INSURANCE | Source: Ambulatory Visit | Attending: Internal Medicine | Admitting: Internal Medicine

## 2016-02-05 ENCOUNTER — Encounter (HOSPITAL_COMMUNITY)
Admission: RE | Disposition: A | Discharge status: Home / Self Care | Payer: Self-pay | Source: Ambulatory Visit | Attending: Internal Medicine

## 2016-02-05 ENCOUNTER — Encounter (HOSPITAL_COMMUNITY): Payer: Self-pay | Admitting: *Deleted

## 2016-02-05 DIAGNOSIS — Z1211 Encounter for screening for malignant neoplasm of colon: Secondary | ICD-10-CM | POA: Diagnosis present

## 2016-02-05 DIAGNOSIS — Z1212 Encounter for screening for malignant neoplasm of rectum: Secondary | ICD-10-CM

## 2016-02-05 DIAGNOSIS — D120 Benign neoplasm of cecum: Secondary | ICD-10-CM

## 2016-02-05 DIAGNOSIS — Z79899 Other long term (current) drug therapy: Secondary | ICD-10-CM | POA: Insufficient documentation

## 2016-02-05 HISTORY — PX: POLYPECTOMY: SHX5525

## 2016-02-05 HISTORY — PX: COLONOSCOPY: SHX5424

## 2016-02-05 SURGERY — COLONOSCOPY
Anesthesia: Moderate Sedation

## 2016-02-05 MED ORDER — STERILE WATER FOR IRRIGATION IR SOLN
Status: DC | PRN
  Administered 2016-02-05: 13:00:00

## 2016-02-05 MED ORDER — MEPERIDINE HCL 100 MG/ML IJ SOLN
INTRAMUSCULAR | Status: AC
  Filled 2016-02-05: qty 2

## 2016-02-05 MED ORDER — ONDANSETRON HCL 4 MG/2ML IJ SOLN
INTRAMUSCULAR | Status: AC
  Filled 2016-02-05: qty 2

## 2016-02-05 MED ORDER — SODIUM CHLORIDE 0.9 % IV SOLN
INTRAVENOUS | Status: DC
  Administered 2016-02-05: 13:00:00 via INTRAVENOUS

## 2016-02-05 MED ORDER — MEPERIDINE HCL 100 MG/ML IJ SOLN
INTRAMUSCULAR | Status: DC | PRN
  Administered 2016-02-05: 25 mg via INTRAVENOUS
  Administered 2016-02-05: 50 mg via INTRAVENOUS

## 2016-02-05 MED ORDER — MIDAZOLAM HCL 5 MG/5ML IJ SOLN
INTRAMUSCULAR | Status: AC
  Filled 2016-02-05: qty 10

## 2016-02-05 MED ORDER — MIDAZOLAM HCL 5 MG/5ML IJ SOLN
INTRAMUSCULAR | Status: DC | PRN
  Administered 2016-02-05: 1 mg via INTRAVENOUS
  Administered 2016-02-05 (×2): 2 mg via INTRAVENOUS

## 2016-02-05 MED ORDER — ONDANSETRON HCL 4 MG/2ML IJ SOLN
INTRAMUSCULAR | Status: DC | PRN
  Administered 2016-02-05: 4 mg via INTRAVENOUS

## 2016-02-05 NOTE — H&P (Signed)
er

## 2016-02-05 NOTE — H&P (Signed)
_0 @   Primary Care Physician:  [facility] Primary Gastroenterologist:  Dr. Gala Romney  Pre-Procedure History & Physical: HPI:  Vincent Henderson is a 55 y.o. male is here for a screening colonoscopy. No family history of colon cancer. No bowel symptoms. No prior colonoscopy.  Past Medical History:  Diagnosis Date  . No pertinent past medical history     Past Surgical History:  Procedure Laterality Date  . APPENDECTOMY    . I&D EXTREMITY  08/16/2011   Procedure: MINOR IRRIGATION AND DEBRIDEMENT EXTREMITY;  Surgeon: Wynonia Sours, MD;  Location: [place];  Service: Orthopedics;  Laterality: Left;  repair open fracture nailbed left ring fing  . OPEN REDUCTION INTERNAL FIXATION (ORIF) DISTAL RADIAL FRACTURE  04/09/2012   Procedure: OPEN REDUCTION INTERNAL FIXATION (ORIF) DISTAL RADIAL FRACTURE;  Surgeon: Roseanne Kaufman, MD;  Location: [place];  Service: Orthopedics;  Laterality: Right;  ORIF RIGHT WRIST FRACTURE WITH ALLOGRAFT BONE GRAFT AND REPAIR/RECONSTRUCTION AS NECCESSARY CASE QUALIFIED FOR MOPUP, PATIENT SEEN IN ED ON CALL*    Prior to Admission medications   Medication Sig Start Date End Date Taking? Authorizing Provider  peg 3350 powder (MOVIPREP) 100 g SOLR Take 1 kit (200 g total) by mouth as directed. 01/08/16  Yes Daneil Dolin, MD  tamsulosin (FLOMAX) 0.4 MG CAPS capsule Take 0.4 mg by mouth daily.   Yes Historical Provider, MD    Allergies as of 01/08/2016  . (No Known Allergies)    Family History  Problem Relation Age of Onset  . Colon cancer Neg Hx     Social History   Social History  . Marital status: Divorced    Spouse name: N/A  . Number of children: N/A  . Years of education: N/A   Occupational History  . Not on file.   Social History Main Topics  . Smoking status: Never Smoker  . Smokeless tobacco: Never Used  . Alcohol use 4.2 oz/week    7 Cans of beer per week     Comment: OCCASSIONAL  . Drug use: No  . Sexual  activity: Not on file   Other Topics Concern  . Not on file   Social History Narrative  . No narrative on file    Review of Systems: See HPI, otherwise negative ROS  Physical Exam: BP (!) 141/93   Pulse 65   Temp 97.7 F (36.5 C) (Oral)   Resp 18   Ht _1  (1.753 m)   Wt 200 lb (90.7 kg)   SpO2 99%   BMI 29.53 kg/m  General:   Alert,  Well-developed, well-nourished, pleasant and cooperative in NAD Head:  Normocephalic and atraumatic. Eyes:  Sclera clear, no icterus.   Conjunctiva pink. Neck:  Supple; no masses or thyromegaly. Lungs:  Clear throughout to auscultation.   No wheezes, crackles, or rhonchi. No acute distress. Heart:  Regular rate and rhythm; no murmurs, clicks, rubs,  or gallops. Abdomen:  Soft, nontender and nondistended. No masses, hepatosplenomegaly or hernias noted. Normal bowel sounds, without guarding, and without rebound.    Impression/Plan: Vincent Henderson is now here to undergo a screening colonoscopy.  First ever average risk screening examination  Risks, benefits, limitations, imponderables and alternatives regarding colonoscopy have been reviewed with the patient. Questions have been answered. All parties agreeable.     Notice:  This dictation was prepared with Dragon dictation along with smaller phrase technology. Any transcriptional errors that result from this process are unintentional and may  not be corrected upon review.

## 2016-02-05 NOTE — Discharge Instructions (Addendum)
°Colonoscopy °Discharge Instructions ° °Read the instructions outlined below and refer to this sheet in the next few weeks. These discharge instructions provide you with general information on caring for yourself after you leave the hospital. Your doctor may also give you specific instructions. While your treatment has been planned according to the most current medical practices available, unavoidable complications occasionally occur. If you have any problems or questions after discharge, call Dr. Rourk at [phone]. °ACTIVITY °· You may resume your regular activity, but move at a slower pace for the next 24 hours.  °· Take frequent rest periods for the next 24 hours.  °· Walking will help get rid of the air and reduce the bloated feeling in your belly (abdomen).  °· No driving for 24 hours (because of the medicine (anesthesia) used during the test).   °· Do not sign any important legal documents or operate any machinery for 24 hours (because of the anesthesia used during the test).  °NUTRITION °· Drink plenty of fluids.  °· You may resume your normal diet as instructed by your doctor.  °· Begin with a light meal and progress to your normal diet. Heavy or fried foods are harder to digest and may make you feel sick to your stomach (nauseated).  °· Avoid alcoholic beverages for 24 hours or as instructed.  °MEDICATIONS °· You may resume your normal medications unless your doctor tells you otherwise.  °WHAT YOU CAN EXPECT TODAY °· Some feelings of bloating in the abdomen.  °· Passage of more gas than usual.  °· Spotting of blood in your stool or on the toilet paper.  °IF YOU HAD POLYPS REMOVED DURING THE COLONOSCOPY: °· No aspirin products for 7 days or as instructed.  °· No alcohol for 7 days or as instructed.  °· Eat a soft diet for the next 24 hours.  °FINDING OUT THE RESULTS OF YOUR TEST °Not all test results are available during your visit. If your test results are not back during the visit, make an appointment  with your caregiver to find out the results. Do not assume everything is normal if you have not heard from your caregiver or the medical facility. It is important for you to follow up on all of your test results.  °SEEK IMMEDIATE MEDICAL ATTENTION IF: °· You have more than a spotting of blood in your stool.  °· Your belly is swollen (abdominal distention).  °· You are nauseated or vomiting.  °· You have a temperature over 101.  °· You have abdominal pain or discomfort that is severe or gets worse throughout the day.  ° ° °Colon polyp information provided °Further recommendations to follow pending review of pathology report ° ° °Colon Polyps °Polyps are lumps of extra tissue growing inside the body. Polyps can grow in the large intestine (colon). Most colon polyps are noncancerous (benign). However, some colon polyps can become cancerous over time. Polyps that are larger than a pea may be harmful. To be safe, caregivers remove and test all polyps. °CAUSES  °Polyps form when mutations in the genes cause your cells to grow and divide even though no more tissue is needed. °RISK FACTORS °There are a number of risk factors that can increase your chances of getting colon polyps. They include: °· Being older than 50 years. °· Family history of colon polyps or colon cancer. °· Long-term colon diseases, such as colitis or Crohn disease. °· Being overweight. °· Smoking. °· Being inactive. °· Drinking too much alcohol. °  SYMPTOMS  °Most small polyps do not cause symptoms. If symptoms are present, they may include: °· Blood in the stool. The stool may look dark red or black. °· Constipation or diarrhea that lasts longer than 1 week. °DIAGNOSIS °People often do not know they have polyps until their caregiver finds them during a regular checkup. Your caregiver can use 4 tests to check for polyps: °· Digital rectal exam. The caregiver wears gloves and feels inside the rectum. This test would find polyps only in the rectum. °· Barium  enema. The caregiver puts a liquid called barium into your rectum before taking X-rays of your colon. Barium makes your colon look white. Polyps are dark, so they are easy to see in the X-ray pictures. °· Sigmoidoscopy. A thin, flexible tube (sigmoidoscope) is placed into your rectum. The sigmoidoscope has a light and tiny camera in it. The caregiver uses the sigmoidoscope to look at the last third of your colon. °· Colonoscopy. This test is like sigmoidoscopy, but the caregiver looks at the entire colon. This is the most common method for finding and removing polyps. °TREATMENT  °Any polyps will be removed during a sigmoidoscopy or colonoscopy. The polyps are then tested for cancer. °PREVENTION  °To help lower your risk of getting more colon polyps: °· Eat plenty of fruits and vegetables. Avoid eating fatty foods. °· Do not smoke. °· Avoid drinking alcohol. °· Exercise every day. °· Lose weight if recommended by your caregiver. °· Eat plenty of calcium and folate. Foods that are rich in calcium include milk, cheese, and broccoli. Foods that are rich in folate include chickpeas, kidney beans, and spinach. °HOME CARE INSTRUCTIONS °Keep all follow-up appointments as directed by your caregiver. You may need periodic exams to check for polyps. °SEEK MEDICAL CARE IF: °You notice bleeding during a bowel movement. °  °This information is not intended to replace advice given to you by your health care provider. Make sure you discuss any questions you have with your health care provider. °  °Document Released: 01/10/2004 Document Revised: 05/06/2014 Document Reviewed: 06/25/2011 °Elsevier Interactive Patient Education ©2016 [employer] ° °

## 2016-02-05 NOTE — Op Note (Signed)
[facility] Patient Name: Vincent Henderson Procedure Date: 02/05/2016 1:06 PM MRN: [id number] Date of Birth: 1961-04-14 Attending MD: Norvel Richards , MD CSN: [id number] Age: 55 Admit Type: Outpatient Procedure:                Colonoscopy with snare polypectomy Indications:              Screening for colorectal malignant neoplasm Providers:                Norvel Richards, MD, Jeanann Lewandowsky. Sharon Seller, RN,                            Randa Spike, Technician Referring MD:              Medicines:                Midazolam 20 mg IV, Meperidine 123XX123 mg IV Complications:            No immediate complications. Estimated Blood Loss:     Estimated blood loss was minimal. Procedure:                Pre-Anesthesia Assessment:                           - Prior to the procedure, a History and Physical                            was performed, and patient medications and                            allergies were reviewed. The patient's tolerance of                            previous anesthesia was also reviewed. The risks                            and benefits of the procedure and the sedation                            options and risks were discussed with the patient.                            All questions were answered, and informed consent                            was obtained. Prior Anticoagulants: The patient has                            taken no previous anticoagulant or antiplatelet                            agents. ASA Grade Assessment: II - A patient with                            mild systemic disease. After reviewing the risks  and benefits, the patient was deemed in                            satisfactory condition to undergo the procedure.                           After obtaining informed consent, the colonoscope                            was passed under direct vision. Throughout the                            procedure, the patient's blood  pressure, pulse, and                            oxygen saturations were monitored continuously. The                            [id number] scope was introduced through                            the anus and advanced to the the cecum, identified                            by appendiceal orifice and ileocecal valve.                            [Completeness]. [Sites Not Well Seen]. The                            ileocecal valve, appendiceal orifice, and rectum                            were photographed. The entire colon was well                            visualized. The patient tolerated the procedure                            well. The quality of the bowel preparation was                            adequate. Scope In: 1:30:19 PM Scope Out: 1:42:47 PM Scope Withdrawal Time: 0 hours 8 minutes 59 seconds  Total Procedure Duration: 0 hours 12 minutes 28 seconds  Findings:      The perianal and digital rectal examinations were normal.      A 3 mm polyp was found in the cecum. The polyp was semi-pedunculated.       The polyp was removed with a cold snare. Resection and retrieval were       complete. Estimated blood loss was minimal.      The exam was otherwise without abnormality on direct and retroflexion       views. Impression:               -  One 3 mm polyp in the cecum, removed with a cold                            snare. Resected and retrieved.                           - The examination was otherwise normal on direct                            and retroflexion views. Moderate Sedation:      Moderate (conscious) sedation was administered by the endoscopy nurse       and supervised by the endoscopist. The following parameters were       monitored: oxygen saturation, heart rate, blood pressure, respiratory       rate, EKG, adequacy of pulmonary ventilation, and response to care.       Total physician intraservice time was 20 minutes. Recommendation:           - Patient has a  contact number available for                            emergencies. The signs and symptoms of potential                            delayed complications were discussed with the                            patient. Return to normal activities tomorrow.                            Written discharge instructions were provided to the                            patient.                           - Resume previous diet.                           - Continue present medications.                           - Repeat colonoscopy date to be determined after                            pending pathology results are reviewed for                            surveillance based on pathology results.                           - Return to GI office (date not yet determined). Procedure Code(s):        --- Professional ---                           [phone], Colonoscopy, flexible; with removal of  tumor(s), polyp(s), or other lesion(s) by snare                            technique                           99152, Moderate sedation services provided by the                            same physician or other qualified health care                            professional performing the diagnostic or                            therapeutic service that the sedation supports,                            requiring the presence of an independent trained                            observer to assist in the monitoring of the                            patient's level of consciousness and physiological                            status; initial 15 minutes of intraservice time,                            patient age 76 years or older Diagnosis Code(s):        --- Professional ---                           Z12.11, Encounter for screening for malignant                            neoplasm of colon                           D12.0, Benign neoplasm of cecum CPT copyright 2016 American Medical Association. All  rights reserved. The codes documented in this report are preliminary and upon coder review may  be revised to meet current compliance requirements. Cristopher Estimable. Lajoya Dombek, MD Norvel Richards, MD 02/05/2016 1:50:32 PM This report has been signed electronically. Number of Addenda: 0

## 2016-02-08 ENCOUNTER — Encounter: Payer: Self-pay | Admitting: Internal Medicine

## 2016-02-09 ENCOUNTER — Encounter (HOSPITAL_COMMUNITY): Payer: Self-pay | Admitting: Internal Medicine

## 2020-07-31 ENCOUNTER — Other Ambulatory Visit (HOSPITAL_COMMUNITY): Payer: Self-pay | Admitting: Family Medicine

## 2020-07-31 ENCOUNTER — Ambulatory Visit (HOSPITAL_COMMUNITY)
Admission: RE | Admit: 2020-07-31 | Discharge: 2020-07-31 | Disposition: A | Discharge status: Home / Self Care | Payer: PRIVATE HEALTH INSURANCE | Source: Ambulatory Visit | Attending: Family Medicine | Admitting: Family Medicine

## 2020-07-31 DIAGNOSIS — M79671 Pain in right foot: Secondary | ICD-10-CM | POA: Insufficient documentation

## 2021-01-02 ENCOUNTER — Encounter: Payer: Self-pay | Admitting: *Deleted

## 2021-01-17 ENCOUNTER — Encounter: Payer: Self-pay | Admitting: Internal Medicine

## 2021-06-02 NOTE — Progress Notes (Signed)
Primary Care Physician:  [facility] Primary Gastroenterologist:  Dr. Gala Romney  Chief Complaint  Patient presents with   Colonoscopy    Due for tcs. Doing ok    HPI:   Vincent Henderson is a 61 y.o. male presenting today to discuss scheduling surveillance colonoscopy.  We last saw this patient at the time of his last colonoscopy in October 2017 with a 3 mm tubular adenoma removed.  Recommended repeat in 5 years.  Recommended office visit due to increased sedation requirements for his last colonoscopy.  Today, patient reports he is doing well overall.  No GI concerns.  Denies abdominal pain, constipation, diarrhea, BRBPR, melena, unintentional weight loss.  Denies reflux symptoms, dysphagia, nausea, or vomiting.  Past Medical History:  Diagnosis Date   HTN (hypertension)     Past Surgical History:  Procedure Laterality Date   APPENDECTOMY     COLONOSCOPY N/A 02/05/2016   Surgeon: Daneil Dolin, MD;  3 mm tubular adenoma removed.  Recommended repeat in 5 years.   I & D EXTREMITY  08/16/2011   Procedure: MINOR IRRIGATION AND DEBRIDEMENT EXTREMITY;  Surgeon: Wynonia Sours, MD;  Location: [place];  Service: Orthopedics;  Laterality: Left;  repair open fracture nailbed left ring fing   OPEN REDUCTION INTERNAL FIXATION (ORIF) DISTAL RADIAL FRACTURE  04/09/2012   Procedure: OPEN REDUCTION INTERNAL FIXATION (ORIF) DISTAL RADIAL FRACTURE;  Surgeon: Roseanne Kaufman, MD;  Location: [place];  Service: Orthopedics;  Laterality: Right;  ORIF RIGHT WRIST FRACTURE WITH ALLOGRAFT BONE GRAFT AND REPAIR/RECONSTRUCTION AS NECCESSARY CASE QUALIFIED FOR MOPUP, PATIENT SEEN IN ED ON CALL*   POLYPECTOMY  02/05/2016   Procedure: POLYPECTOMY;  Surgeon: Daneil Dolin, MD;  [facility];  Service: Endoscopy;;  colon    Current Outpatient Medications  Medication Sig Dispense Refill   aspirin EC 81 MG tablet Take 81 mg by mouth daily. Swallow whole.     olmesartan  (BENICAR) 40 MG tablet Take 40 mg by mouth daily.     No current facility-administered medications for this visit.    Allergies as of 06/04/2021   (No Known Allergies)    Family History  Problem Relation Age of Onset   Colon cancer Neg Hx     Social History   Socioeconomic History   Marital status: Divorced    Spouse name: Not on file   Number of children: Not on file   Years of education: Not on file   Highest education level: Not on file  Occupational History   Not on file  Tobacco Use   Smoking status: Never   Smokeless tobacco: Never  Substance and Sexual Activity   Alcohol use: Yes    Alcohol/week: 7.0 standard drinks    Types: 7 Cans of beer per week    Comment: OCCASSIONAL   Drug use: No   Sexual activity: Not on file  Other Topics Concern   Not on file  Social History Narrative   Not on file   Social Determinants of Health   Financial Resource Strain: Not on file  Food Insecurity: Not on file  Transportation Needs: Not on file  Physical Activity: Not on file  Stress: Not on file  Social Connections: Not on file  Intimate Partner Violence: Not on file    Review of Systems: Gen: Denies any fever, chills, cold or flulike symptoms, presyncope, syncope. CV: Denies chest pain, heart palpitations. Resp: Denies shortness of breath or cough. GI:  See HPI GU : Denies urinary burning, urinary frequency, urinary hesitancy MS: Denies joint pain. Derm: Denies rash. Psych: Denies depression, anxiety. Heme: See HPI  Physical Exam: BP 124/80    Pulse 86    Temp (!) 97.3 F (36.3 C) (Temporal)    Ht 5\' 9"  (1.753 m)    Wt 216 lb 6.4 oz (98.2 kg)    BMI 31.96 kg/m  General:   Alert and oriented. Pleasant and cooperative. Well-nourished and well-developed.  Head:  Normocephalic and atraumatic. Eyes:  Without icterus, sclera clear and conjunctiva pink.  Ears:  Normal auditory acuity. Lungs:  Clear to auscultation bilaterally. No wheezes, rales, or rhonchi. No  distress.  Heart:  S1, S2 present without murmurs appreciated.  Abdomen: +BS, soft, non-tender and non-distended. No HSM noted. No guarding or rebound. No masses appreciated.  Rectal:  Deferred  Msk:  Symmetrical without gross deformities. Normal posture. Extremities:  Without edema. Neurologic:  Alert and  oriented x4;  grossly normal neurologically. Skin:  Intact without significant lesions or rashes. Psych: Normal mood and affect.    Assessment:  61 year old male with history of HTN and adenomatous colon polyps, presenting today to discuss scheduling surveillance colonoscopy.  His last colonoscopy was October 2017 revealing a 3 mm tubular adenoma with recommendations to repeat in 5 years.  He had increased sedation requirements for his last colonoscopy and will benefit from propofol with his next colonoscopy.  He has no significant GI symptoms at this time and no alarm symptoms.  No family history of colon cancer.   As Dr. Gala Romney is out of the office for the next several weeks, discussed scheduling colonoscopy with Dr. Abbey Chatters, but patient prefers to wait until Dr. Gala Romney returns.   Plan: Proceed with colonoscopy with propofol with Dr. Gala Romney in the near future.The risks, benefits, and alternatives have been discussed with the patient in detail. The patient states understanding and desires to proceed. ASA 2 Follow-up as needed   Aliene Altes, [facility] 06/04/2021

## 2021-06-04 ENCOUNTER — Other Ambulatory Visit: Payer: Self-pay

## 2021-06-04 ENCOUNTER — Ambulatory Visit (INDEPENDENT_AMBULATORY_CARE_PROVIDER_SITE_OTHER): Payer: PRIVATE HEALTH INSURANCE | Admitting: Gastroenterology

## 2021-06-04 ENCOUNTER — Encounter: Payer: Self-pay | Admitting: Gastroenterology

## 2021-06-04 VITALS — BP 124/80 | HR 86 | Temp 97.3°F | Ht 69.0 in | Wt 216.4 lb

## 2021-06-04 DIAGNOSIS — Z860101 Personal history of adenomatous and serrated colon polyps: Secondary | ICD-10-CM | POA: Insufficient documentation

## 2021-06-04 DIAGNOSIS — Z86010 Personal history of colonic polyps: Secondary | ICD-10-CM

## 2021-06-04 NOTE — Patient Instructions (Signed)
We will arrange for you to have a colonoscopy in the near future with Dr. Gala Romney.  We will follow-up with you as needed.  Do not hesitate to call if you have any new GI concerns.  It was a pleasure meeting you today!  Aliene Altes, PA-C [facility]

## 2021-06-25 ENCOUNTER — Telehealth: Payer: Self-pay | Admitting: *Deleted

## 2021-06-25 MED ORDER — PEG 3350-KCL-NA BICARB-NACL 420 G PO SOLR: ORAL | 0 refills | Status: AC

## 2021-06-25 NOTE — Telephone Encounter (Signed)
Called pt. He is scheduled for TCS w/ Dr. Gala Romney, ASA 2 on 3/6 at 9:45am. Aware will leave instructions for p/u at front desk. Rx sent to pharmacy.   Called [facility] and spoke with [employer]. Was advised no PA required. Call ref# [id number]

## 2021-07-02 ENCOUNTER — Encounter (HOSPITAL_COMMUNITY)
Admission: RE | Disposition: A | Discharge status: Home / Self Care | Payer: Self-pay | Source: Ambulatory Visit | Attending: Internal Medicine

## 2021-07-02 ENCOUNTER — Other Ambulatory Visit: Payer: Self-pay

## 2021-07-02 ENCOUNTER — Ambulatory Visit (HOSPITAL_COMMUNITY)
Admission: RE | Admit: 2021-07-02 | Discharge: 2021-07-02 | Disposition: A | Discharge status: Home / Self Care | Payer: PRIVATE HEALTH INSURANCE | Source: Ambulatory Visit | Attending: Internal Medicine | Admitting: Internal Medicine

## 2021-07-02 ENCOUNTER — Encounter (HOSPITAL_COMMUNITY): Payer: Self-pay | Admitting: Internal Medicine

## 2021-07-02 ENCOUNTER — Ambulatory Visit (HOSPITAL_COMMUNITY): Payer: PRIVATE HEALTH INSURANCE | Admitting: Anesthesiology

## 2021-07-02 ENCOUNTER — Ambulatory Visit (HOSPITAL_BASED_OUTPATIENT_CLINIC_OR_DEPARTMENT_OTHER): Payer: PRIVATE HEALTH INSURANCE | Admitting: Anesthesiology

## 2021-07-02 DIAGNOSIS — D123 Benign neoplasm of transverse colon: Secondary | ICD-10-CM | POA: Insufficient documentation

## 2021-07-02 DIAGNOSIS — Z1211 Encounter for screening for malignant neoplasm of colon: Secondary | ICD-10-CM | POA: Insufficient documentation

## 2021-07-02 DIAGNOSIS — K635 Polyp of colon: Secondary | ICD-10-CM

## 2021-07-02 DIAGNOSIS — K5730 Diverticulosis of large intestine without perforation or abscess without bleeding: Secondary | ICD-10-CM | POA: Insufficient documentation

## 2021-07-02 DIAGNOSIS — K6289 Other specified diseases of anus and rectum: Secondary | ICD-10-CM

## 2021-07-02 DIAGNOSIS — Z86010 Personal history of colonic polyps: Secondary | ICD-10-CM | POA: Diagnosis not present

## 2021-07-02 DIAGNOSIS — K621 Rectal polyp: Secondary | ICD-10-CM | POA: Insufficient documentation

## 2021-07-02 HISTORY — PX: POLYPECTOMY: SHX5525

## 2021-07-02 HISTORY — PX: COLONOSCOPY WITH PROPOFOL: SHX5780

## 2021-07-02 SURGERY — COLONOSCOPY WITH PROPOFOL
Anesthesia: General

## 2021-07-02 MED ORDER — LACTATED RINGERS IV SOLN: INTRAVENOUS | Status: DC

## 2021-07-02 MED ORDER — LACTATED RINGERS IV SOLN
INTRAVENOUS | Status: DC | PRN
Start: 2021-07-02 — End: 2021-07-02

## 2021-07-02 MED ORDER — LIDOCAINE 2% (20 MG/ML) 5 ML SYRINGE
INTRAMUSCULAR | Status: DC | PRN
  Administered 2021-07-02: 50 mg via INTRAVENOUS

## 2021-07-02 MED ORDER — PROPOFOL 500 MG/50ML IV EMUL
INTRAVENOUS | Status: DC | PRN
  Administered 2021-07-02: 200 ug/kg/min via INTRAVENOUS

## 2021-07-02 MED ORDER — PROPOFOL 10 MG/ML IV BOLUS
INTRAVENOUS | Status: DC | PRN
  Administered 2021-07-02: 90 mg via INTRAVENOUS

## 2021-07-02 NOTE — Op Note (Signed)
[facility] ?Patient Name: Vincent Henderson ?Procedure Date: 07/02/2021 10:03 AM ?MRN: [id number] ?Date of Birth: May 04, 1960 ?Attending MD: Norvel Richards , MD ?CSN: [id number] ?Age: 61 ?Admit Type: Outpatient ?Procedure:                Colonoscopy ?Indications:              High risk colon cancer surveillance: Personal  ?                          history of colonic polyps ?Providers:                Norvel Richards, MD, Janeece Riggers, RN, Eugene Garnet  ?                          Shanon Brow, Technician ?Referring MD:              ?Medicines:                Propofol per Anesthesia ?Complications:            No immediate complications. ?Estimated Blood Loss:     Estimated blood loss was minimal. ?Procedure:                Pre-Anesthesia Assessment: ?                          - Prior to the procedure, a History and Physical  ?                          was performed, and patient medications and  ?                          allergies were reviewed. The patient's tolerance of  ?                          previous anesthesia was also reviewed. The risks  ?                          and benefits of the procedure and the sedation  ?                          options and risks were discussed with the patient.  ?                          All questions were answered, and informed consent  ?                          was obtained. Prior Anticoagulants: The patient has  ?                          taken no previous anticoagulant or antiplatelet  ?                          agents. ASA Grade Assessment: II - A patient with  ?  mild systemic disease. After reviewing the risks  ?                          and benefits, the patient was deemed in  ?                          satisfactory condition to undergo the procedure. ?                          After obtaining informed consent, the colonoscope  ?                          was passed under direct vision. Throughout the  ?                          procedure, the patient's  blood pressure, pulse, and  ?                          oxygen saturations were monitored continuously. The  ?                          [id number] scope was introduced through the  ?                          anus and advanced to the the cecum, identified by  ?                          appendiceal orifice and ileocecal valve. The  ?                          colonoscopy was performed without difficulty. The  ?                          patient tolerated the procedure well. The quality  ?                          of the bowel preparation was adequate. ?Scope In: 10:20:28 AM ?Scope Out: 10:37:55 AM ?Scope Withdrawal Time: 0 hours 14 minutes 31 seconds  ?Total Procedure Duration: 0 hours 17 minutes 27 seconds  ?Findings: ?     The perianal and digital rectal examinations were normal. ?     Scattered small and large-mouthed diverticula were found in the sigmoid  ?     colon, descending colon and transverse colon. ?     A 6 mm polyp was found in the distal transverse colon. The polyp was  ?     semi-pedunculated. The polyp was removed with a cold snare. Resection  ?     and retrieval were complete. Estimated blood loss was minimal. ?     A 4 mm polyp was found in the distal rectum. The polyp was pedunculated.  ?     This polyp arose from the anorectal junction on the prominent stalk.  ?     There were adjacent anal papilla; this lesion looked distinctly  ?     different. See photos. The polyp was removed with a hot snare. Resection  ?  and retrieval were complete. Estimated blood loss was minimal. ?     The exam was otherwise without abnormality on direct and retroflexion  ?     views. ?Impression:               - Diverticulosis in the sigmoid colon, in the  ?                          descending colon and in the transverse colon. ?                          - One 6 mm polyp in the distal transverse colon,  ?                          removed with a cold snare. Resected and retrieved. ?                          - One  4 mm polyp in the distal rectum, removed with  ?                          a hot snare. Resected and retrieved. ?                          - The examination was otherwise normal on direct  ?                          and retroflexion views. ?Moderate Sedation: ?     Moderate (conscious) sedation was personally administered by an  ?     anesthesia professional. The following parameters were monitored: oxygen  ?     saturation, heart rate, blood pressure, respiratory rate, EKG, adequacy  ?     of pulmonary ventilation, and response to care. ?Recommendation:           - Patient has a contact number available for  ?                          emergencies. The signs and symptoms of potential  ?                          delayed complications were discussed with the  ?                          patient. Return to normal activities tomorrow.  ?                          Written discharge instructions were provided to the  ?                          patient. ?                          - Advance diet as tolerated. ?                          - Continue present medications. ?                          -  Repeat colonoscopy date to be determined after  ?                          pending pathology results are reviewed for  ?                          surveillance based on pathology results. ?                          - Return to GI office (date not yet determined). ?Procedure Code(s):        --- Professional --- ?                          [id number], Colonoscopy, flexible; with removal of  ?                          tumor(s), polyp(s), or other lesion(s) by snare  ?                          technique ?Diagnosis Code(s):        --- Professional --- ?                          Z86.010, Personal history of colonic polyps ?                          K63.5, Polyp of colon ?                          K62.1, Rectal polyp ?                          K57.30, Diverticulosis of large intestine without  ?                          perforation or abscess  without bleeding ?CPT copyright 2019 American Medical Association. All rights reserved. ?The codes documented in this report are preliminary and upon coder review may  ?be revised to meet current compliance requirements. ?Cristopher Estimable. Josephanthony Tindel, MD ?Norvel Richards, MD ?07/02/2021 10:57:36 AM ?This report has been signed electronically. ?Number of Addenda: 0 ?

## 2021-07-02 NOTE — Anesthesia Preprocedure Evaluation (Signed)
Anesthesia Evaluation  Patient identified by MRN, date of birth, ID band Patient awake    Reviewed: Allergy & Precautions, H&P , NPO status , Patient's Chart, lab work & pertinent test results, reviewed documented beta blocker date and time   Airway Mallampati: II  TM Distance: >3 FB Neck ROM: full    Dental no notable dental hx.    Pulmonary neg pulmonary ROS,    Pulmonary exam normal breath sounds clear to auscultation       Cardiovascular Exercise Tolerance: Good hypertension, negative cardio ROS   Rhythm:regular Rate:Normal     Neuro/Psych negative neurological ROS  negative psych ROS   GI/Hepatic negative GI ROS, Neg liver ROS,   Endo/Other  negative endocrine ROS  Renal/GU negative Renal ROS  negative genitourinary   Musculoskeletal   Abdominal   Peds  Hematology negative hematology ROS (+)   Anesthesia Other Findings   Reproductive/Obstetrics negative OB ROS                             Anesthesia Physical Anesthesia Plan  ASA: 2  Anesthesia Plan: General   Post-op Pain Management:    Induction:   PONV Risk Score and Plan: Propofol infusion  Airway Management Planned:   Additional Equipment:   Intra-op Plan:   Post-operative Plan:   Informed Consent: I have reviewed the patients History and Physical, chart, labs and discussed the procedure including the risks, benefits and alternatives for the proposed anesthesia with the patient or authorized representative who has indicated his/her understanding and acceptance.     Dental Advisory Given  Plan Discussed with: CRNA  Anesthesia Plan Comments:         Anesthesia Quick Evaluation  

## 2021-07-02 NOTE — Discharge Instructions (Addendum)
?  Colonoscopy ?Discharge Instructions ? ?Read the instructions outlined below and refer to this sheet in the next few weeks. These discharge instructions provide you with general information on caring for yourself after you leave the hospital. Your doctor may also give you specific instructions. While your treatment has been planned according to the most current medical practices available, unavoidable complications occasionally occur. If you have any problems or questions after discharge, call Dr. Gala Romney at [phone]. ?ACTIVITY ?You may resume your regular activity, but move at a slower pace for the next 24 hours.  ?Take frequent rest periods for the next 24 hours.  ?Walking will help get rid of the air and reduce the bloated feeling in your belly (abdomen).  ?No driving for 24 hours (because of the medicine (anesthesia) used during the test).   ?Do not sign any important legal documents or operate any machinery for 24 hours (because of the anesthesia used during the test).  ?NUTRITION ?Drink plenty of fluids.  ?You may resume your normal diet as instructed by your doctor.  ?Begin with a light meal and progress to your normal diet. Heavy or fried foods are harder to digest and may make you feel sick to your stomach (nauseated).  ?Avoid alcoholic beverages for 24 hours or as instructed.  ?MEDICATIONS ?You may resume your normal medications unless your doctor tells you otherwise.  ?WHAT YOU CAN EXPECT TODAY ?Some feelings of bloating in the abdomen.  ?Passage of more gas than usual.  ?Spotting of blood in your stool or on the toilet paper.  ?IF YOU HAD POLYPS REMOVED DURING THE COLONOSCOPY: ?No aspirin products for 7 days or as instructed.  ?No alcohol for 7 days or as instructed.  ?Eat a soft diet for the next 24 hours.  ?FINDING OUT THE RESULTS OF YOUR TEST ?Not all test results are available during your visit. If your test results are not back during the visit, make an appointment with your caregiver to find out the  results. Do not assume everything is normal if you have not heard from your caregiver or the medical facility. It is important for you to follow up on all of your test results.  ?SEEK IMMEDIATE MEDICAL ATTENTION IF: ?You have more than a spotting of blood in your stool.  ?Your belly is swollen (abdominal distention).  ?You are nauseated or vomiting.  ?You have a temperature over 101.  ?You have abdominal pain or discomfort that is severe or gets worse throughout the day.   ? ? ?2 polyps removed in your colon today ? ?Colon polyp and diverticulosis information provided ? ?Further recommendations to follow pending review of pathology report ? ?At patient request, I called Oleh Genin at [phone] -call rolled to voicemail.  Left a message. ?

## 2021-07-02 NOTE — H&P (Signed)
?$'@LOGO'P$ @ ? ? ?Primary Care Physician:  [facility] ?Primary Gastroenterologist:  Dr. Gala Romney ? ?Pre-Procedure History & Physical: ?HPI:  Vincent Henderson is a 61 y.o. male here for colonoscopy.  History of small cecal adenoma removed 2017; here for surveillance examination.  No bowel symptoms. ? ?Past Medical History:  ?Diagnosis Date  ? HTN (hypertension)   ? ? ?Past Surgical History:  ?Procedure Laterality Date  ? APPENDECTOMY    ? COLONOSCOPY N/A 02/05/2016  ? Surgeon: Daneil Dolin, MD;  3 mm tubular adenoma removed.  Recommended repeat in 5 years.  ? I & D EXTREMITY  08/16/2011  ? Procedure: MINOR IRRIGATION AND DEBRIDEMENT EXTREMITY;  Surgeon: Wynonia Sours, MD;  Location: [facility];  Service: Orthopedics;  Laterality: Left;  repair open fracture nailbed left ring fing  ? OPEN REDUCTION INTERNAL FIXATION (ORIF) DISTAL RADIAL FRACTURE  04/09/2012  ? Procedure: OPEN REDUCTION INTERNAL FIXATION (ORIF) DISTAL RADIAL FRACTURE;  Surgeon: Roseanne Kaufman, MD;  Location: [facility];  Service: Orthopedics;  Laterality: Right;  ORIF RIGHT WRIST FRACTURE WITH ALLOGRAFT BONE GRAFT AND REPAIR/RECONSTRUCTION AS NECCESSARY ?CASE QUALIFIED FOR MOPUP, PATIENT SEEN IN ED ON CALL*  ? POLYPECTOMY  02/05/2016  ? Procedure: POLYPECTOMY;  Surgeon: Daneil Dolin, MD;  [facility]: [facility];  Service: Endoscopy;;  colon  ? ? ?Prior to Admission medications   ?Medication Sig Start Date End Date Taking? Authorizing Provider  ?aspirin EC 81 MG tablet Take 81 mg by mouth daily. Swallow whole.   Yes [provider]  ?olmesartan (BENICAR) 40 MG tablet Take 40 mg by mouth daily. 04/04/21  Yes [provider]  ?polyethylene glycol-electrolytes (NULYTELY) 420 g solution As directed 06/25/21  Yes Karimah Winquist, Cristopher Estimable, MD  ? ? ?Allergies as of 06/25/2021  ? (No Known Allergies)  ? ? ?Family History  ?Problem Relation Age of Onset  ? Colon cancer Neg Hx   ? ? ?Social History  ? ?Socioeconomic History  ?  Marital status: Divorced  ?  Spouse name: Not on file  ? Number of children: Not on file  ? Years of education: Not on file  ? Highest education level: Not on file  ?Occupational History  ? Not on file  ?Tobacco Use  ? Smoking status: Never  ? Smokeless tobacco: Never  ?Substance and Sexual Activity  ? Alcohol use: Yes  ?  Alcohol/week: 7.0 standard drinks  ?  Types: 7 Cans of beer per week  ?  Comment: OCCASSIONAL  ? Drug use: No  ? Sexual activity: Not on file  ?Other Topics Concern  ? Not on file  ?Social History Narrative  ? Not on file  ? ?Social Determinants of Health  ? ?Financial Resource Strain: Not on file  ?Food Insecurity: Not on file  ?Transportation Needs: Not on file  ?Physical Activity: Not on file  ?Stress: Not on file  ?Social Connections: Not on file  ?Intimate Partner Violence: Not on file  ? ? ?Review of Systems: ?See HPI, otherwise negative ROS ? ?Physical Exam: ?BP (!) 142/96   Pulse 69   Temp 97.9 ?F (36.6 ?C) (Oral)   Resp 15   Ht '5\' 9"'$  (1.753 m)   Wt 98 kg   SpO2 100%   BMI 31.90 kg/m?  ?General:   Alert,  Well-developed, well-nourished, pleasant and cooperative in NAD ?Neck:  Supple; no masses or thyromegaly. No significant cervical adenopathy. ?Lungs:  Clear throughout to auscultation.   No wheezes, crackles,  or rhonchi. No acute distress. ?Heart:  Regular rate and rhythm; no murmurs, clicks, rubs,  or gallops. ?Abdomen: Non-distended, normal bowel sounds.  Soft and nontender without appreciable mass or hepatosplenomegaly.  ? ?Impression/Plan: Pleasant 61 year old gentleman history of cecal adenoma removed previously; here for surveillance colonoscopy per plan. ?I have offered the patient a surveillance colonoscopy today. ?The risks, benefits, limitations, alternatives and imponderables have been reviewed with the patient. Questions have been answered. All parties are agreeable.   ? ? ? ?Notice: This dictation was prepared with Dragon dictation along with smaller phrase technology.  Any transcriptional errors that result from this process are unintentional and may not be corrected upon review.  ?

## 2021-07-02 NOTE — Transfer of Care (Signed)
Immediate Anesthesia Transfer of Care Note ? ?Patient: Vincent Henderson ? ?Procedure(s) Performed: COLONOSCOPY WITH PROPOFOL ?POLYPECTOMY ? ?Patient Location: Endoscopy Unit ? ?Anesthesia Type:MAC ? ?Level of Consciousness: sedated, patient cooperative and responds to stimulation ? ?Airway & Oxygen Therapy: Patient Spontanous Breathing ? ?Post-op Assessment: Report given to RN, Post -op Vital signs reviewed and stable and Patient moving all extremities ? ?Post vital signs: Reviewed and stable ? ?Last Vitals:  ?Vitals Value Taken Time  ?BP    ?Temp    ?Pulse    ?Resp    ?SpO2    ? ? ?Last Pain:  ?Vitals:  ? 07/02/21 1002  ?TempSrc:   ?PainSc: 0-No pain  ?   ? ?Patients Stated Pain Goal: 7 (07/02/21 6629) ? ?Complications: No notable events documented. ?

## 2021-07-03 NOTE — Anesthesia Postprocedure Evaluation (Signed)
Anesthesia Post Note ? ?Patient: Vincent Henderson ? ?Procedure(s) Performed: COLONOSCOPY WITH PROPOFOL ?POLYPECTOMY ? ?Patient location during evaluation: Phase II ?Anesthesia Type: General ?Level of consciousness: awake ?Pain management: pain level controlled ?Vital Signs Assessment: post-procedure vital signs reviewed and stable ?Respiratory status: spontaneous breathing and respiratory function stable ?Cardiovascular status: blood pressure returned to baseline and stable ?Postop Assessment: no headache and no apparent nausea or vomiting ?Anesthetic complications: no ?Comments: Late entry ? ? ?No notable events documented. ? ? ?Last Vitals:  ?Vitals:  ? 07/02/21 1042 07/02/21 1047  ?BP: (!) 90/59 101/61  ?Pulse:    ?Resp: 12 20  ?Temp: 36.9 ?C   ?SpO2: 95% 98%  ?  ?Last Pain:  ?Vitals:  ? 07/02/21 1048  ?TempSrc:   ?PainSc: 0-No pain  ? ? ?  ?  ?  ?  ?  ?  ? ?Louann Sjogren ? ? ? ? ?

## 2021-07-04 ENCOUNTER — Encounter: Payer: Self-pay | Admitting: Internal Medicine

## 2021-07-04 LAB — SURGICAL PATHOLOGY

## 2021-07-05 ENCOUNTER — Encounter (HOSPITAL_COMMUNITY): Payer: Self-pay | Admitting: Internal Medicine

## 2022-08-29 IMAGING — DX DG FOOT COMPLETE 3+V*R*
3 series · 3 of 3 positions shown · non-contrast
Comparison: None.

CLINICAL DATA: Right foot pain for several weeks.  No known injury.

EXAM:
RIGHT FOOT COMPLETE - 3+ VIEW

[foot ap]
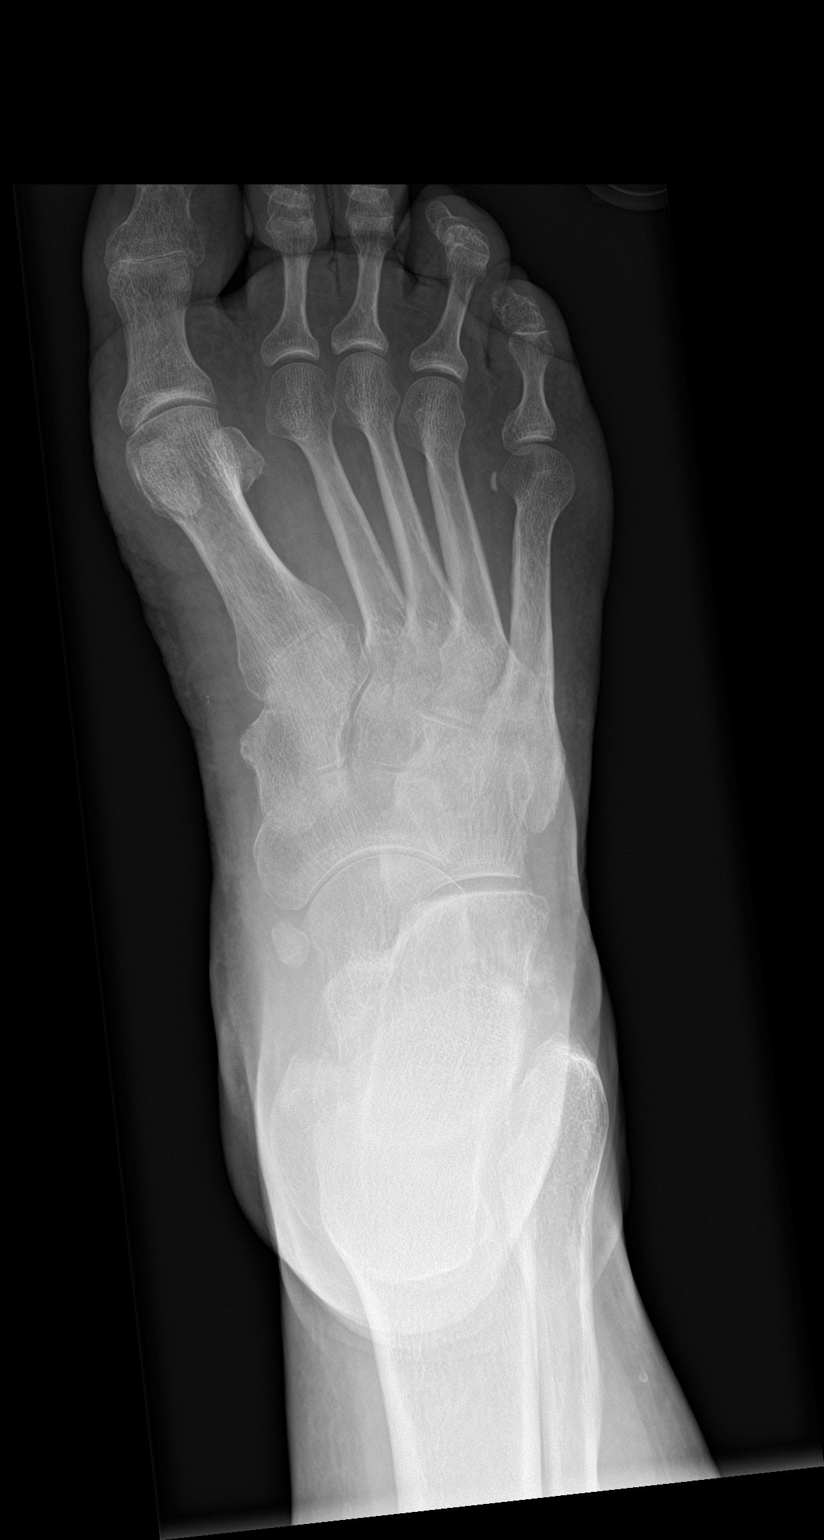

[foot obl]
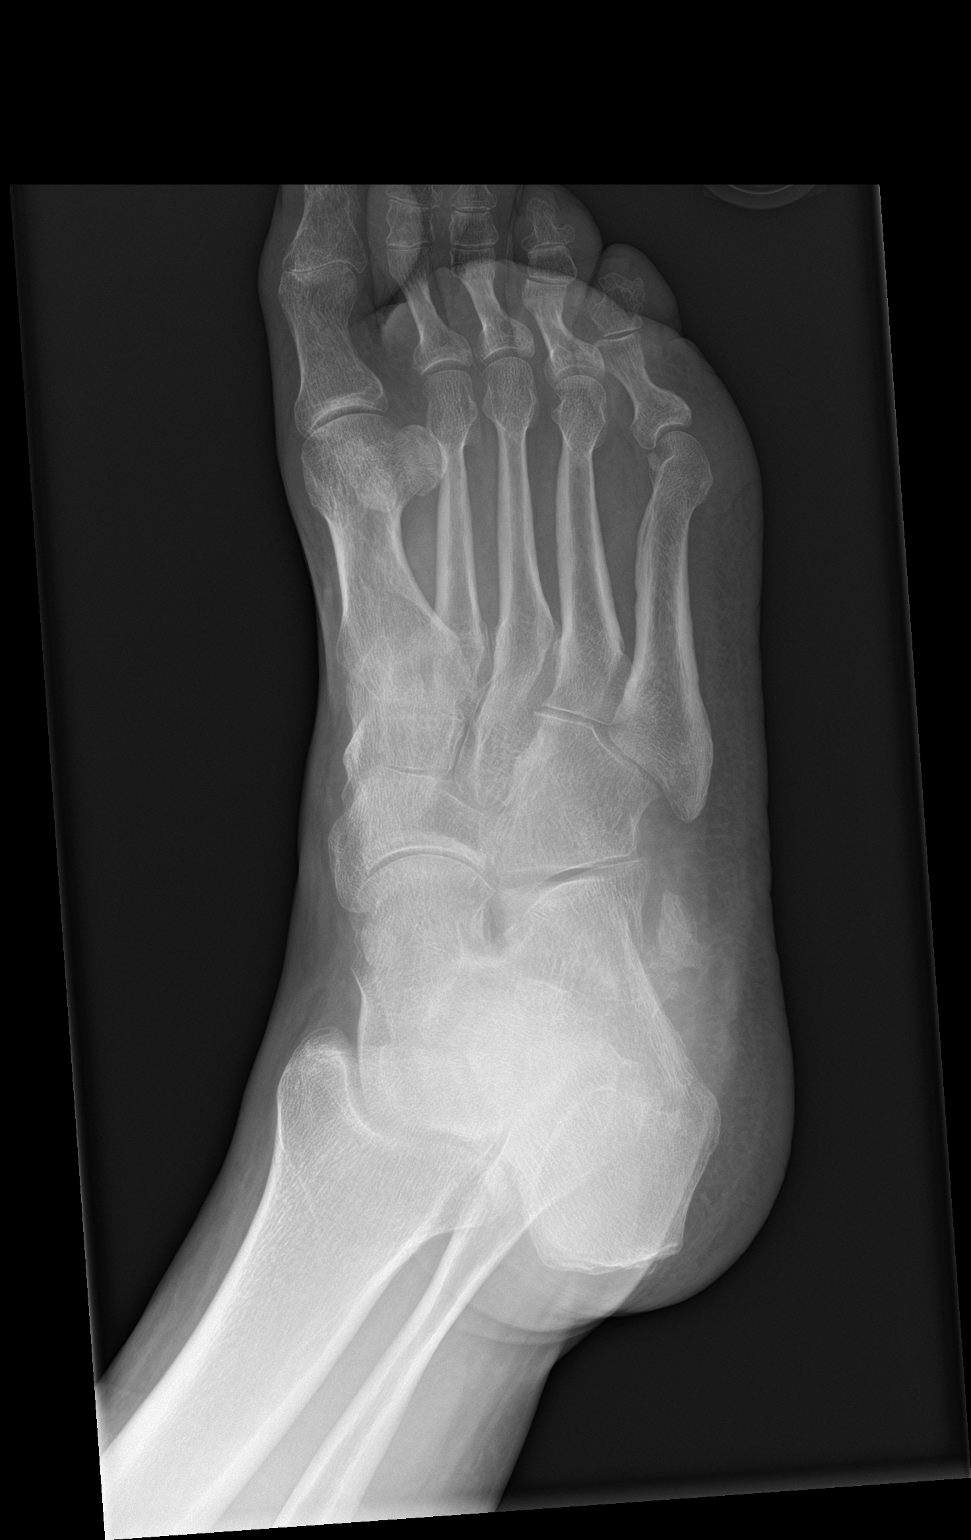

[foot lat]
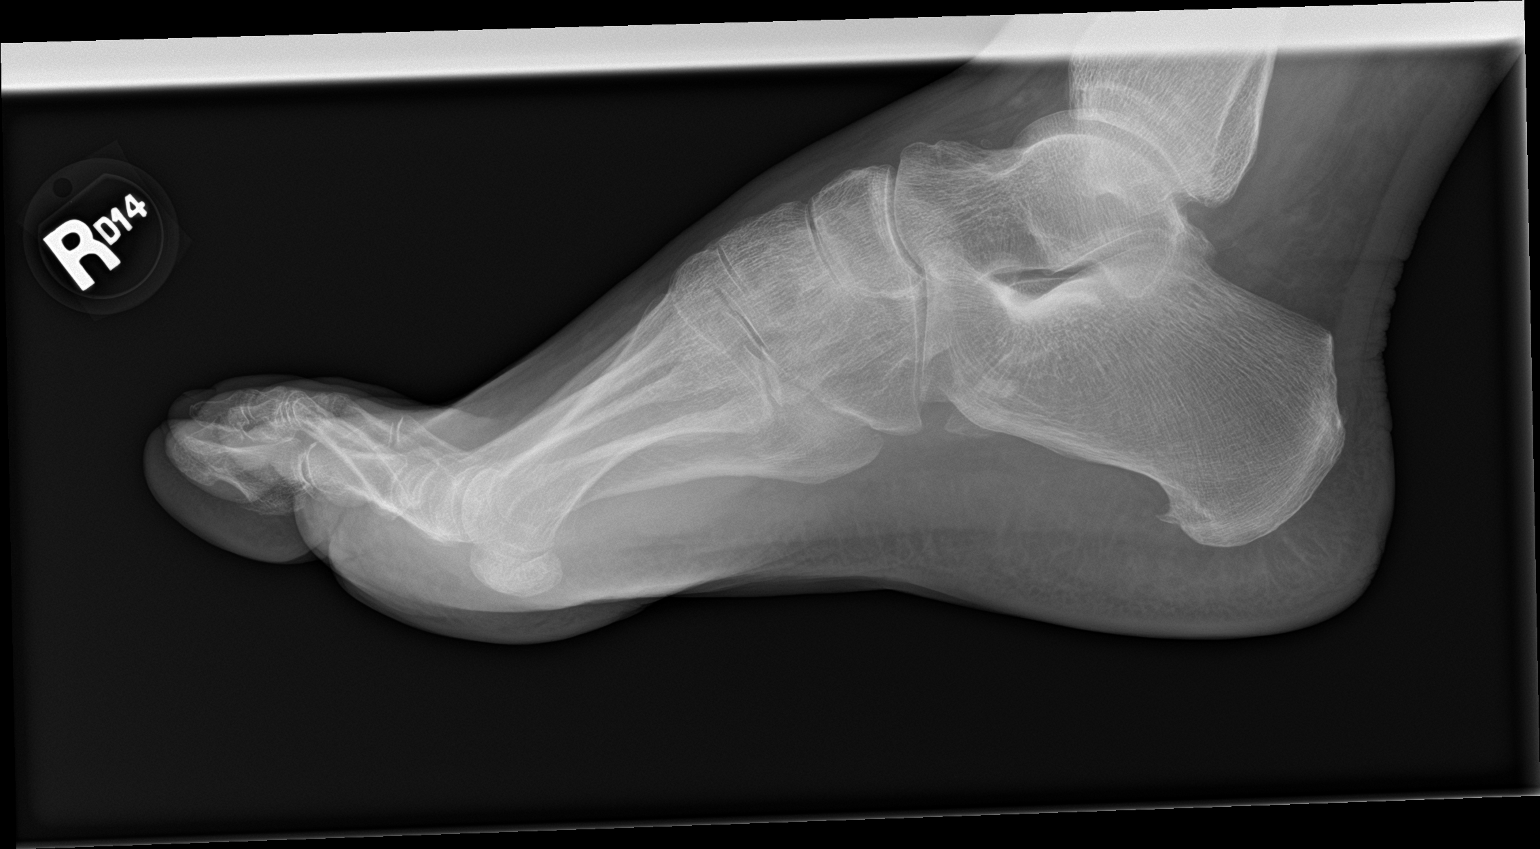

[3 of 3 positions shown; findings below may reference images not displayed]

FINDINGS: The bones are subjectively under mineralized. There are hammertoe
deformity of the second through fifth digits. Mild degenerative
change involving the great toe. Slight Hdfourk Mikazuchi. No erosion or bony
destruction. There is an os navicular and os peroneal. Os peroneal
slightly irregular. Moderate plantar calcaneal spur. No focal soft
tissue abnormality.
IMPRESSION: 1. Hammertoe deformity of the second through fifth digits. Mild
degenerative change involving the great toe.
2. Moderate plantar calcaneal spur.
3. Os peroneal which is mildly irregular, nonspecific.

## 2023-12-01 ENCOUNTER — Ambulatory Visit (HOSPITAL_COMMUNITY)
Admission: RE | Admit: 2023-12-01 | Discharge: 2023-12-01 | Disposition: A | Discharge status: Home / Self Care | Source: Ambulatory Visit | Attending: Internal Medicine | Admitting: Internal Medicine

## 2023-12-01 ENCOUNTER — Other Ambulatory Visit (HOSPITAL_COMMUNITY): Payer: Self-pay | Admitting: Internal Medicine

## 2023-12-01 DIAGNOSIS — L03115 Cellulitis of right lower limb: Secondary | ICD-10-CM | POA: Insufficient documentation
